# Patient Record
Sex: Male | Born: 1975
Health system: Southern US, Community
[De-identification: ages and names within clinical notes are randomized; demographics above are authoritative.]

## PROBLEM LIST (undated history)

## (undated) DIAGNOSIS — Z87442 Personal history of urinary calculi: Secondary | ICD-10-CM

## (undated) DIAGNOSIS — K589 Irritable bowel syndrome without diarrhea: Secondary | ICD-10-CM

## (undated) DIAGNOSIS — J302 Other seasonal allergic rhinitis: Secondary | ICD-10-CM

## (undated) HISTORY — PX: COLONOSCOPY: SHX5424

## (undated) HISTORY — PX: LITHOTRIPSY: SUR834

## (undated) HISTORY — PX: VASECTOMY: SHX75

## (undated) HISTORY — PX: MYRINGOTOMY: SUR874

---

## 2014-04-10 ENCOUNTER — Other Ambulatory Visit: Payer: Self-pay | Admitting: Urology

## 2014-04-10 ENCOUNTER — Ambulatory Visit (INDEPENDENT_AMBULATORY_CARE_PROVIDER_SITE_OTHER): Payer: BC Managed Care – PPO | Admitting: Urology

## 2014-04-10 ENCOUNTER — Ambulatory Visit (HOSPITAL_COMMUNITY)
Admission: RE | Admit: 2014-04-10 | Discharge: 2014-04-10 | Disposition: A | Discharge status: Home / Self Care | Payer: BC Managed Care – PPO | Source: Ambulatory Visit | Attending: Urology | Admitting: Urology

## 2014-04-10 DIAGNOSIS — R312 Other microscopic hematuria: Secondary | ICD-10-CM

## 2014-04-10 DIAGNOSIS — R109 Unspecified abdominal pain: Secondary | ICD-10-CM | POA: Diagnosis present

## 2014-04-10 DIAGNOSIS — N200 Calculus of kidney: Secondary | ICD-10-CM

## 2014-07-24 ENCOUNTER — Encounter (INDEPENDENT_AMBULATORY_CARE_PROVIDER_SITE_OTHER): Payer: BLUE CROSS/BLUE SHIELD | Admitting: Urology

## 2014-07-24 DIAGNOSIS — Z302 Encounter for sterilization: Secondary | ICD-10-CM

## 2015-12-17 ENCOUNTER — Other Ambulatory Visit: Payer: Self-pay | Admitting: Urology

## 2015-12-17 DIAGNOSIS — N200 Calculus of kidney: Secondary | ICD-10-CM

## 2015-12-27 ENCOUNTER — Other Ambulatory Visit: Payer: Self-pay | Admitting: Urology

## 2015-12-27 ENCOUNTER — Ambulatory Visit (INDEPENDENT_AMBULATORY_CARE_PROVIDER_SITE_OTHER): Payer: BLUE CROSS/BLUE SHIELD | Admitting: Urology

## 2015-12-27 ENCOUNTER — Other Ambulatory Visit (HOSPITAL_COMMUNITY)
Admission: RE | Admit: 2015-12-27 | Discharge: 2015-12-27 | Disposition: A | Discharge status: Home / Self Care | Payer: BLUE CROSS/BLUE SHIELD | Source: Ambulatory Visit | Attending: Urology | Admitting: Urology

## 2015-12-27 ENCOUNTER — Ambulatory Visit (HOSPITAL_COMMUNITY)
Admission: RE | Admit: 2015-12-27 | Discharge: 2015-12-27 | Disposition: A | Discharge status: Home / Self Care | Payer: BLUE CROSS/BLUE SHIELD | Source: Ambulatory Visit | Attending: Urology | Admitting: Urology

## 2015-12-27 DIAGNOSIS — N200 Calculus of kidney: Secondary | ICD-10-CM

## 2015-12-27 DIAGNOSIS — R3129 Other microscopic hematuria: Secondary | ICD-10-CM

## 2015-12-27 LAB — BASIC METABOLIC PANEL
Anion gap: 7 (ref 5–15)
BUN: 12 mg/dL (ref 6–20)
CALCIUM: 9.2 mg/dL (ref 8.9–10.3)
CO2: 26 mmol/L (ref 22–32)
Chloride: 103 mmol/L (ref 101–111)
Creatinine, Ser: 0.88 mg/dL (ref 0.61–1.24)
GFR calc Af Amer: >60 mL/min (ref >60)
GLUCOSE: 86 mg/dL (ref 65–99)
POTASSIUM: 3.8 mmol/L (ref 3.5–5.1)
SODIUM: 136 mmol/L (ref 135–145)

## 2015-12-27 LAB — URIC ACID: URIC ACID, SERUM: 5 mg/dL (ref 4.4–7.6)

## 2015-12-28 LAB — PARATHYROID HORMONE, INTACT (NO CA): PTH: 28 pg/mL (ref 15–65)

## 2015-12-30 ENCOUNTER — Other Ambulatory Visit: Payer: Self-pay | Admitting: Urology

## 2016-01-01 ENCOUNTER — Encounter (HOSPITAL_COMMUNITY): Payer: Self-pay

## 2016-01-06 ENCOUNTER — Ambulatory Visit (HOSPITAL_COMMUNITY)
Admission: RE | Admit: 2016-01-06 | Discharge: 2016-01-06 | Disposition: A | Discharge status: Home / Self Care | Payer: BLUE CROSS/BLUE SHIELD | Source: Ambulatory Visit | Attending: Urology | Admitting: Urology

## 2016-01-06 ENCOUNTER — Encounter (HOSPITAL_COMMUNITY): Payer: Self-pay | Admitting: *Deleted

## 2016-01-06 ENCOUNTER — Encounter (HOSPITAL_COMMUNITY)
Admission: RE | Disposition: A | Discharge status: Home / Self Care | Payer: Self-pay | Source: Ambulatory Visit | Attending: Urology

## 2016-01-06 ENCOUNTER — Ambulatory Visit (HOSPITAL_COMMUNITY): Payer: BLUE CROSS/BLUE SHIELD

## 2016-01-06 DIAGNOSIS — N200 Calculus of kidney: Secondary | ICD-10-CM | POA: Diagnosis present

## 2016-01-06 HISTORY — DX: Irritable bowel syndrome, unspecified: K58.9

## 2016-01-06 HISTORY — DX: Personal history of urinary calculi: Z87.442

## 2016-01-06 HISTORY — DX: Other seasonal allergic rhinitis: J30.2

## 2016-01-06 SURGERY — LITHOTRIPSY, ESWL
Anesthesia: LOCAL | Laterality: Left

## 2016-01-06 MED ORDER — HYDROCODONE-ACETAMINOPHEN 5-325 MG PO TABS: 1.0000 | ORAL_TABLET | Freq: Four times a day (QID) | ORAL | 0 refills | Status: DC | PRN

## 2016-01-06 MED ORDER — CIPROFLOXACIN HCL 500 MG PO TABS
500.0000 mg | ORAL_TABLET | ORAL | Status: AC
  Administered 2016-01-06: 500 mg via ORAL
  Filled 2016-01-06: qty 1

## 2016-01-06 MED ORDER — DIAZEPAM 5 MG PO TABS
10.0000 mg | ORAL_TABLET | ORAL | Status: AC
  Administered 2016-01-06: 10 mg via ORAL
  Filled 2016-01-06: qty 2

## 2016-01-06 MED ORDER — DIPHENHYDRAMINE HCL 25 MG PO CAPS
25.0000 mg | ORAL_CAPSULE | ORAL | Status: AC
  Administered 2016-01-06: 25 mg via ORAL
  Filled 2016-01-06: qty 1

## 2016-01-06 MED ORDER — SODIUM CHLORIDE 0.9 % IV SOLN
INTRAVENOUS | Status: DC
  Administered 2016-01-06: 09:00:00 via INTRAVENOUS

## 2016-01-06 MED ORDER — ACETAMINOPHEN 325 MG PO TABS
650.0000 mg | ORAL_TABLET | ORAL | Status: AC
  Administered 2016-01-06: 650 mg via ORAL
  Filled 2016-01-06: qty 2

## 2016-01-06 NOTE — H&P (Signed)
[facility] Visit     12/27/2015   --------------------------------------------------------------------------------   Scheryl Darter  MRN: [id number]  PRIMARY CARE:  Pat Kocher, MD  DOB: 06-24-1975, 40 year old Male  REFERRING:    SSN: [id number]  PROVIDER:  [facility]    TREATING:  Nicolette Bang, M.D.    LOCATION:  [facility] [phone]   --------------------------------------------------------------------------------   CC: I have kidney stones.  HPI: Brendan Bowman is a 40 year-old male patient who is here for renal calculi.  The problem is on both sides. He first stated noticing pain on approximately 12/07/1990. He is not currently having flank pain, back pain, groin pain, nausea, vomiting, fever or chills. He has caught a stone in his urine strainer since his symptoms began.   He has never had surgical treatment for calculi in the past.   12/27/2015: KUB shows L 1.5cm calculus and right 8-5m calculus. He denies any flank pain. He used to take protein suppliments but has stopped protein suppliments.  24 hour urine 2 years ago showed high calcium, high uric acid, low citrate.     ALLERGIES: No Allergies    MEDICATIONS: Colestid TABS Oral     GU PSH: No GU PSH      PSH Notes: Adenoidectomy   NON-GU PSH: Remove Adenoids - 04/10/2014    GU PMH: Kidney Stone, Nephrolithiasis - 04/10/2014 Other microscopic hematuria, Microscopic hematuria - 04/10/2014    NON-GU PMH: Encounter for other contraceptive management, Visit for postvasectomy sperm count - 11/27/2014 Carbuncle of groin, Boil, groin - 07/24/2014 Personal history of other diseases of the digestive system, History of irritable bowel syndrome - 04/10/2014 Encounter for general adult medical examination without abnormal findings, Encounter for preventive health examination    FAMILY HISTORY: acute poliomyelitis - Runs In Family Atrial Fibrillation - Runs In Family Hypertension - Runs In  Family Myocardial Infarction - Runs In Family Prostate Cancer - Runs In Family   SOCIAL HISTORY: Marital Status: Married Current Smoking Status: Patient has never smoked.  Does not drink anymore.  Drinks 3 caffeinated drinks per day.     Notes: Alcohol use, Occupation, Married, Caffeine use, Never used tobacco, Number of children   REVIEW OF SYSTEMS:    GU Review Male:   Patient denies frequent urination, hard to postpone urination, burning/ pain with urination, get up at night to urinate, leakage of urine, stream starts and stops, trouble starting your stream, have to strain to urinate , erection problems, and penile pain.  Gastrointestinal (Upper):   Patient denies nausea, vomiting, and indigestion/ heartburn.  Gastrointestinal (Lower):   Patient denies diarrhea and constipation.  Constitutional:   Patient denies fever, night sweats, weight loss, and fatigue.  Skin:   Patient denies skin rash/ lesion and itching.  Eyes:   Patient denies blurred vision and double vision.  Ears/ Nose/ Throat:   Patient denies sore throat and sinus problems.  Hematologic/Lymphatic:   Patient denies swollen glands and easy bruising.  Cardiovascular:   Patient denies leg swelling and chest pains.  Respiratory:   Patient denies shortness of breath and cough.  Endocrine:   Patient denies excessive thirst.  Musculoskeletal:   Patient denies back pain and joint pain.  Neurological:   Patient denies headaches and dizziness.  Psychologic:   Patient denies depression and anxiety.   VITAL SIGNS:      12/27/2015 11:16 AM  Weight 150 lb / 68.04 kg  BP 128/64 mmHg  Pulse 70 /  min  Temperature 98.4 F / 37 C   MULTI-SYSTEM PHYSICAL EXAMINATION:    Constitutional: Well-nourished. No physical deformities. Normally developed. Good grooming.  Neck: Neck symmetrical, not swollen. Normal tracheal position.  Respiratory: No labored breathing, no use of accessory muscles.   Cardiovascular: Normal temperature, normal  extremity pulses, no swelling, no varicosities.  Lymphatic: No enlargement of neck, axillae, groin.  Skin: No paleness, no jaundice, no cyanosis. No lesion, no ulcer, no rash.  Neurologic / Psychiatric: Oriented to time, oriented to place, oriented to person. No depression, no anxiety, no agitation.  Gastrointestinal: No mass, no tenderness, no rigidity, non obese abdomen.  Eyes: Normal conjunctivae. Normal eyelids.  Ears, Nose, Mouth, and Throat: Left ear no scars, no lesions, no masses. Right ear no scars, no lesions, no masses. Nose no scars, no lesions, no masses. Normal hearing. Normal lips.  Musculoskeletal: Normal gait and station of head and neck.     PAST DATA REVIEWED:  Source Of History:  Patient   PROCEDURES: None   ASSESSMENT:      ICD-10 Details  1 GU:   Kidney Stone - N20.0   2   Other microscopic hematuria - R31.29    PLAN:           Document Letter(s):  Created for Patient: Clinical Summary    We discussed treatment approaches to renal stones including observation, medical  expulsive therapy (MET), ureteroscopy (URS), shockwave lithotripsy (SWL), and  percutaneous nephrostolithotomy (PCNL) with the later preferred for cases of larger  stones or in cases of unique / complex anatomy. We discussed the relative merits of the  techniques including risks, benefits, and predicted efficacy with PCNL, URS, and SWL  being presented in order of decreasing efficacy, but also decreasing risk in most patients.  After careful consideration, the patient has elected to undergo L ESWL        Notes:   Nephrolithiasis:  -Schedule for L ESWL   Will proceed with 24 hour urine after ESWL  -iPTH, uric acid, BMP      * Signed by Nicolette Bang, M.D. on 12/30/15 at 6:39 AM (EDT)     The information contained in this medical record document is considered private and confidential patient information. This information can only be used for the medical diagnosis and/or medical  services that are being provided by the patient's selected caregivers. This information can only be distributed outside of the patient's care if the patient agrees and signs waivers of authorization for this information to be sent to an outside source or route.

## 2016-01-06 NOTE — Discharge Instructions (Addendum)
Moderate Conscious Sedation, Adult, Care After Refer to this sheet in the next few weeks. These instructions provide you with information on caring for yourself after your procedure. Your health care provider may also give you more specific instructions. Your treatment has been planned according to current medical practices, but problems sometimes occur. Call your health care provider if you have any problems or questions after your procedure. WHAT TO EXPECT AFTER THE PROCEDURE  After your procedure:  You may feel sleepy, clumsy, and have poor balance for several hours.  Vomiting may occur if you eat too soon after the procedure. HOME CARE INSTRUCTIONS  Do not participate in any activities where you could become injured for at least 24 hours. Do not:  Drive.  Swim.  Ride a bicycle.  Operate heavy machinery.  Cook.  Use power tools.  Climb ladders.  Work from a high place.  Do not make important decisions or sign legal documents until you are improved.  If you vomit, drink water, juice, or soup when you can drink without vomiting. Make sure you have little or no nausea before eating solid foods.  Only take over-the-counter or prescription medicines for pain, discomfort, or fever as directed by your health care provider.  Make sure you and your family fully understand everything about the medicines given to you, including what side effects may occur.  You should not drink alcohol, take sleeping pills, or take medicines that cause drowsiness for at least 24 hours.  If you smoke, do not smoke without supervision.  If you are feeling better, you may resume normal activities 24 hours after you were sedated.  Keep all appointments with your health care provider. SEEK MEDICAL CARE IF:  Your skin is pale or bluish in color.  You continue to feel nauseous or vomit.  Your pain is getting worse and is not helped by medicine.  You have bleeding or swelling.  You are still  sleepy or feeling clumsy after 24 hours. SEEK IMMEDIATE MEDICAL CARE IF:  You develop a rash.  You have difficulty breathing.  You develop any type of allergic problem.  You have a fever. MAKE SURE YOU:  Understand these instructions.  Will watch your condition.  Will get help right away if you are not doing well or get worse.   This information is not intended to replace advice given to you by your health care provider. Make sure you discuss any questions you have with your health care provider.   Document Released: 03/15/2013 Document Revised: 06/15/2014 Document Reviewed: 03/15/2013 Elsevier Interactive Patient Education 2016 [employer]. See [facility] discharge instructions in chart.

## 2016-01-06 NOTE — Interval H&P Note (Signed)
History and Physical Interval Note:  01/06/2016 10:31 AM  Brendan Bowman  has presented today for surgery, with the diagnosis of LEFT RENAL CALCULUS  The various methods of treatment have been discussed with the patient and family. After consideration of risks, benefits and other options for treatment, the patient has consented to  Procedure(s) with comments: EXTRACORPOREAL SHOCK WAVE LITHOTRIPSY (ESWL) (Left) - [phone] [id number]  as a surgical intervention .  The patient's history has been reviewed, patient examined, no change in status, stable for surgery.  I have reviewed the patient's chart and labs.  Questions were answered to the patient's satisfaction.     [name] S

## 2016-01-06 NOTE — Op Note (Signed)
See [facility] OP note scanned into chart. 

## 2016-01-15 ENCOUNTER — Other Ambulatory Visit (HOSPITAL_COMMUNITY)
Admission: RE | Admit: 2016-01-15 | Discharge: 2016-01-15 | Disposition: A | Discharge status: Home / Self Care | Payer: BLUE CROSS/BLUE SHIELD | Source: Ambulatory Visit | Attending: Urology | Admitting: Urology

## 2016-01-15 DIAGNOSIS — N200 Calculus of kidney: Secondary | ICD-10-CM | POA: Insufficient documentation

## 2016-01-22 ENCOUNTER — Ambulatory Visit (HOSPITAL_COMMUNITY)
Admission: RE | Admit: 2016-01-22 | Discharge: 2016-01-22 | Disposition: A | Discharge status: Home / Self Care | Payer: BLUE CROSS/BLUE SHIELD | Source: Ambulatory Visit | Attending: Urology | Admitting: Urology

## 2016-01-22 ENCOUNTER — Ambulatory Visit (INDEPENDENT_AMBULATORY_CARE_PROVIDER_SITE_OTHER): Payer: Self-pay | Admitting: Urology

## 2016-01-22 ENCOUNTER — Other Ambulatory Visit: Payer: Self-pay | Admitting: Urology

## 2016-01-22 DIAGNOSIS — Z9889 Other specified postprocedural states: Secondary | ICD-10-CM

## 2016-01-22 DIAGNOSIS — N200 Calculus of kidney: Secondary | ICD-10-CM | POA: Insufficient documentation

## 2016-01-28 LAB — STONE ANALYSIS
CA OXALATE, DIHYDRATE: 5 %
CA OXALATE, MONOHYDR.: 85 %
Ca phos cry stone ql IR: 10 %
STONE WEIGHT KSTONE: 94 mg

## 2016-02-05 ENCOUNTER — Ambulatory Visit (HOSPITAL_COMMUNITY)
Admission: RE | Admit: 2016-02-05 | Discharge: 2016-02-05 | Disposition: A | Discharge status: Home / Self Care | Payer: BLUE CROSS/BLUE SHIELD | Source: Ambulatory Visit | Attending: Urology | Admitting: Urology

## 2016-02-05 ENCOUNTER — Ambulatory Visit (INDEPENDENT_AMBULATORY_CARE_PROVIDER_SITE_OTHER): Payer: BLUE CROSS/BLUE SHIELD | Admitting: Urology

## 2016-02-05 ENCOUNTER — Other Ambulatory Visit: Payer: Self-pay | Admitting: Urology

## 2016-02-05 DIAGNOSIS — Z9889 Other specified postprocedural states: Secondary | ICD-10-CM | POA: Diagnosis not present

## 2016-02-05 DIAGNOSIS — N202 Calculus of kidney with calculus of ureter: Secondary | ICD-10-CM | POA: Diagnosis not present

## 2016-02-05 DIAGNOSIS — N200 Calculus of kidney: Secondary | ICD-10-CM

## 2016-02-05 DIAGNOSIS — R9349 Abnormal radiologic findings on diagnostic imaging of other urinary organs: Secondary | ICD-10-CM | POA: Insufficient documentation

## 2016-02-14 NOTE — Patient Instructions (Addendum)
Your procedure is scheduled on: 02/19/2016  Report to [facility] at   9:30  AM.  Call this number if you have problems the morning of surgery: [phone]   Remember:   Do not drink or eat food:After Midnight.  :  Take these medicines the morning of surgery with A SIP OF WATER: Flonase, Norco and Toradol if needed  Do not wear jewelry, make-up or nail polish.  Do not wear lotions, powders, or perfumes. You may wear deodorant.  Do not shave 48 hours prior to surgery. Men may shave face and neck.  Do not bring valuables to the hospital.  Contacts, dentures or bridgework may not be worn into surgery.  Leave suitcase in the car. After surgery it may be brought to your room.  For patients admitted to the hospital, checkout time is 11:00 AM the day of discharge.   Patients discharged the day of surgery will not be allowed to drive home.    Special Instructions: Shower using CHG night before surgery and shower the day of surgery use CHG.  Use special wash - you have one bottle of CHG for all showers.  You should use approximately 1/2 of the bottle for each shower.  Cystoscopy Cystoscopy is a procedure that is used to help your caregiver diagnose and sometimes treat conditions that affect your lower urinary tract. Your lower urinary tract includes your bladder and the tube through which urine passes from your bladder out of your body (urethra). Cystoscopy is performed with a thin, tube-shaped instrument (cystoscope). The cystoscope has lenses and a light at the end so that your caregiver can see inside your bladder. The cystoscope is inserted at the entrance of your urethra. Your caregiver guides it through your urethra and into your bladder. There are two main types of cystoscopy:  Flexible cystoscopy (with a flexible cystoscope).  Rigid cystoscopy (with a rigid cystoscope). Cystoscopy may be recommended for many conditions, including:  Urinary tract infections.  Blood in your urine  (hematuria).  Loss of bladder control (urinary incontinence) or overactive bladder.  Unusual cells found in a urine sample.  Urinary blockage.  Painful urination. Cystoscopy may also be done to remove a sample of your tissue to be checked under a microscope (biopsy). It may also be done to remove or destroy bladder stones. LET YOUR CAREGIVER KNOW ABOUT:  Allergies to food or medicine.  Medicines taken, including vitamins, herbs, eyedrops, over-the-counter medicines, and creams.  Use of steroids (by mouth or creams).  Previous problems with anesthetics or numbing medicines.  History of bleeding problems or blood clots.  Previous surgery.  Other health problems, including diabetes and kidney problems.  Possibility of pregnancy, if this applies. PROCEDURE The area around the opening to your urethra will be cleaned. A medicine to numb your urethra (local anesthetic) is used. If a tissue sample or stone is removed during the procedure, you may be given a medicine to make you sleep (general anesthetic). Your caregiver will gently insert the tip of the cystoscope into your urethra. The cystoscope will be slowly glided through your urethra and into your bladder. Sterile fluid will flow through the cystoscope and into your bladder. The fluid will expand and stretch your bladder. This gives your caregiver a better view of your bladder walls. The procedure lasts about 15-20 minutes. AFTER THE PROCEDURE If a local anesthetic is used, you will be allowed to go home as soon as you are ready. If a general anesthetic is used, you  will be taken to a recovery area until you are stable. You may have temporary bleeding and burning on urination.   This information is not intended to replace advice given to you by your health care provider. Make sure you discuss any questions you have with your health care provider.   Document Released: 05/22/2000 Document Revised: 06/15/2014 Document Reviewed:  11/16/2011 Elsevier Interactive Patient Education 2016 Elsevier Inc[employer] Cystoscopy, Care After Refer to this sheet in the next few weeks. These instructions provide you with information on caring for yourself after your procedure. Your caregiver may also give you more specific instructions. Your treatment has been planned according to current medical practices, but problems sometimes occur. Call your caregiver if you have any problems or questions after your procedure. HOME CARE INSTRUCTIONS  Things you can do to ease any discomfort after your procedure include:  Drinking enough water and fluids to keep your urine clear or pale yellow.  Taking a warm bath to relieve any burning feelings. SEEK IMMEDIATE MEDICAL CARE IF:   You have an increase in blood in your urine.  You notice blood clots in your urine.  You have difficulty passing urine.  You have the chills.  You have abdominal pain.  You have a fever or persistent symptoms for more than 2-3 days.  You have a fever and your symptoms suddenly get worse. MAKE SURE YOU:   Understand these instructions.  Will watch your condition.  Will get help right away if you are not doing well or get worse.   This information is not intended to replace advice given to you by your health care provider. Make sure you discuss any questions you have with your health care provider.   Document Released: 12/12/2004 Document Revised: 06/15/2014 Document Reviewed: 11/16/2011 Elsevier Interactive Patient Education 2016 Elsevier Inc.  Anesthesia, Adult, Care After Refer to this sheet in the next few weeks. These instructions provide you with information on caring for yourself after your procedure. Your health care provider may also give you more specific instructions. Your treatment has been planned according to current medical practices, but problems sometimes occur. Call your health care provider if you have any problems or questions after your  procedure. WHAT TO EXPECT AFTER THE PROCEDURE After the procedure, it is typical to experience:  Sleepiness.  Nausea and vomiting. HOME CARE INSTRUCTIONS  For the first 24 hours after general anesthesia:  Have a responsible person with you.  Do not drive a car. If you are alone, do not take public transportation.  Do not drink alcohol.  Do not take medicine that has not been prescribed by your health care provider.  Do not sign important papers or make important decisions.  You may resume a normal diet and activities as directed by your health care provider.  Change bandages (dressings) as directed.  If you have questions or problems that seem related to general anesthesia, call the hospital and ask for the anesthetist or anesthesiologist on call. SEEK MEDICAL CARE IF:  You have nausea and vomiting that continue the day after anesthesia.  You develop a rash. SEEK IMMEDIATE MEDICAL CARE IF:   You have difficulty breathing.  You have chest pain.  You have any allergic problems.   This information is not intended to replace advice given to you by your health care provider. Make sure you discuss any questions you have with your health care provider.   Document Released: 08/31/2000 Document Revised: 06/15/2014 Document Reviewed: 09/23/2011 Elsevier Interactive Patient Education  2016 [employer].

## 2016-02-17 ENCOUNTER — Ambulatory Visit (HOSPITAL_COMMUNITY): Admission: RE | Admit: 2016-02-17 | Payer: BLUE CROSS/BLUE SHIELD | Source: Ambulatory Visit

## 2016-02-17 ENCOUNTER — Encounter (HOSPITAL_COMMUNITY): Payer: Self-pay

## 2016-02-17 ENCOUNTER — Encounter (HOSPITAL_COMMUNITY)
Admission: RE | Admit: 2016-02-17 | Discharge: 2016-02-17 | Disposition: A | Discharge status: Home / Self Care | Payer: BLUE CROSS/BLUE SHIELD | Source: Ambulatory Visit | Attending: Urology | Admitting: Urology

## 2016-02-17 DIAGNOSIS — Z01811 Encounter for preprocedural respiratory examination: Secondary | ICD-10-CM

## 2016-02-17 DIAGNOSIS — Z79899 Other long term (current) drug therapy: Secondary | ICD-10-CM | POA: Diagnosis not present

## 2016-02-17 DIAGNOSIS — R109 Unspecified abdominal pain: Secondary | ICD-10-CM | POA: Diagnosis present

## 2016-02-17 DIAGNOSIS — Z7951 Long term (current) use of inhaled steroids: Secondary | ICD-10-CM | POA: Diagnosis not present

## 2016-02-17 DIAGNOSIS — N200 Calculus of kidney: Secondary | ICD-10-CM | POA: Diagnosis not present

## 2016-02-17 LAB — BASIC METABOLIC PANEL
ANION GAP: 11 (ref 5–15)
BUN: 15 mg/dL (ref 6–20)
CHLORIDE: 103 mmol/L (ref 101–111)
CO2: 26 mmol/L (ref 22–32)
Calcium: 9.5 mg/dL (ref 8.9–10.3)
Creatinine, Ser: 0.85 mg/dL (ref 0.61–1.24)
GFR calc Af Amer: >60 mL/min (ref >60)
GLUCOSE: 77 mg/dL (ref 65–99)
POTASSIUM: 3.8 mmol/L (ref 3.5–5.1)
SODIUM: 140 mmol/L (ref 135–145)

## 2016-02-17 LAB — CBC
HCT: 45.2 % (ref 39.0–52.0)
HEMOGLOBIN: 15.4 g/dL (ref 13.0–17.0)
MCH: 30.7 pg (ref 26.0–34.0)
MCHC: 34.1 g/dL (ref 30.0–36.0)
MCV: 90 fL (ref 78.0–100.0)
Platelets: 234 10*3/uL (ref 150–400)
RBC: 5.02 MIL/uL (ref 4.22–5.81)
RDW: 12.4 % (ref 11.5–15.5)
WBC: 9.3 10*3/uL (ref 4.0–10.5)

## 2016-02-19 ENCOUNTER — Ambulatory Visit (HOSPITAL_COMMUNITY): Payer: BLUE CROSS/BLUE SHIELD | Admitting: Anesthesiology

## 2016-02-19 ENCOUNTER — Ambulatory Visit (HOSPITAL_COMMUNITY): Payer: BLUE CROSS/BLUE SHIELD

## 2016-02-19 ENCOUNTER — Encounter (HOSPITAL_COMMUNITY)
Admission: RE | Disposition: A | Discharge status: Home / Self Care | Payer: Self-pay | Source: Ambulatory Visit | Attending: Urology

## 2016-02-19 ENCOUNTER — Encounter (HOSPITAL_COMMUNITY): Payer: Self-pay | Admitting: Certified Registered Nurse Anesthetist

## 2016-02-19 ENCOUNTER — Ambulatory Visit (HOSPITAL_COMMUNITY)
Admission: RE | Admit: 2016-02-19 | Discharge: 2016-02-19 | Disposition: A | Discharge status: Home / Self Care | Payer: BLUE CROSS/BLUE SHIELD | Source: Ambulatory Visit | Attending: Urology | Admitting: Urology

## 2016-02-19 DIAGNOSIS — N201 Calculus of ureter: Secondary | ICD-10-CM

## 2016-02-19 DIAGNOSIS — Z7951 Long term (current) use of inhaled steroids: Secondary | ICD-10-CM | POA: Insufficient documentation

## 2016-02-19 DIAGNOSIS — Z79899 Other long term (current) drug therapy: Secondary | ICD-10-CM | POA: Insufficient documentation

## 2016-02-19 DIAGNOSIS — N200 Calculus of kidney: Secondary | ICD-10-CM | POA: Insufficient documentation

## 2016-02-19 HISTORY — PX: HOLMIUM LASER APPLICATION: SHX5852

## 2016-02-19 HISTORY — PX: CYSTOSCOPY WITH STENT PLACEMENT: SHX5790

## 2016-02-19 HISTORY — PX: CYSTOSCOPY/RETROGRADE/URETEROSCOPY/STONE EXTRACTION WITH BASKET: SHX5317

## 2016-02-19 SURGERY — CYSTOSCOPY, WITH CALCULUS REMOVAL USING BASKET
Anesthesia: General

## 2016-02-19 MED ORDER — DEXAMETHASONE SODIUM PHOSPHATE 4 MG/ML IJ SOLN
INTRAMUSCULAR | Status: AC
  Filled 2016-02-19: qty 1

## 2016-02-19 MED ORDER — DIATRIZOATE MEGLUMINE 30 % UR SOLN
URETHRAL | Status: AC
  Filled 2016-02-19: qty 300

## 2016-02-19 MED ORDER — PROPOFOL 10 MG/ML IV BOLUS
INTRAVENOUS | Status: DC | PRN
  Administered 2016-02-19: 200 mg via INTRAVENOUS

## 2016-02-19 MED ORDER — FENTANYL CITRATE (PF) 100 MCG/2ML IJ SOLN
25.0000 ug | INTRAMUSCULAR | Status: DC | PRN
  Administered 2016-02-19: 25 ug via INTRAVENOUS

## 2016-02-19 MED ORDER — KETOROLAC TROMETHAMINE 10 MG PO TABS: 10.0000 mg | ORAL_TABLET | Freq: Every day | ORAL | 0 refills | Status: DC | PRN

## 2016-02-19 MED ORDER — LACTATED RINGERS IV SOLN
INTRAVENOUS | Status: DC
  Administered 2016-02-19 (×2): via INTRAVENOUS

## 2016-02-19 MED ORDER — PROPOFOL 10 MG/ML IV BOLUS
INTRAVENOUS | Status: AC
  Filled 2016-02-19: qty 20

## 2016-02-19 MED ORDER — CEFAZOLIN SODIUM-DEXTROSE 2-4 GM/100ML-% IV SOLN
2.0000 g | Freq: Once | INTRAVENOUS | Status: AC
  Administered 2016-02-19: 2 g via INTRAVENOUS
  Filled 2016-02-19: qty 100

## 2016-02-19 MED ORDER — ONDANSETRON HCL 4 MG/2ML IJ SOLN
4.0000 mg | Freq: Once | INTRAMUSCULAR | Status: AC
  Administered 2016-02-19: 4 mg via INTRAVENOUS

## 2016-02-19 MED ORDER — TAMSULOSIN HCL 0.4 MG PO CAPS: 0.4000 mg | ORAL_CAPSULE | Freq: Every day | ORAL | 0 refills | Status: DC

## 2016-02-19 MED ORDER — DIATRIZOATE MEGLUMINE 30 % UR SOLN
URETHRAL | Status: DC | PRN
  Administered 2016-02-19: 100 mL

## 2016-02-19 MED ORDER — SODIUM CHLORIDE 0.9 % IR SOLN
Status: DC | PRN
  Administered 2016-02-19 (×2): 3000 mL

## 2016-02-19 MED ORDER — FENTANYL CITRATE (PF) 100 MCG/2ML IJ SOLN
INTRAMUSCULAR | Status: AC
  Filled 2016-02-19: qty 2

## 2016-02-19 MED ORDER — OXYCODONE-ACETAMINOPHEN 5-325 MG PO TABS: 1.0000 | ORAL_TABLET | Freq: Every day | ORAL | 0 refills | Status: DC | PRN

## 2016-02-19 MED ORDER — FENTANYL CITRATE (PF) 100 MCG/2ML IJ SOLN
25.0000 ug | INTRAMUSCULAR | Status: DC | PRN
  Administered 2016-02-19: 25 ug via INTRAVENOUS
  Filled 2016-02-19 (×2): qty 2

## 2016-02-19 MED ORDER — ONDANSETRON HCL 4 MG/2ML IJ SOLN
INTRAMUSCULAR | Status: AC
  Filled 2016-02-19: qty 2

## 2016-02-19 MED ORDER — HYDROMORPHONE HCL 1 MG/ML IJ SOLN
0.2500 mg | INTRAMUSCULAR | Status: DC | PRN
  Administered 2016-02-19 (×2): 0.5 mg via INTRAVENOUS
  Filled 2016-02-19: qty 1

## 2016-02-19 MED ORDER — OXYCODONE-ACETAMINOPHEN 5-325 MG PO TABS: 1.0000 | ORAL_TABLET | Freq: Four times a day (QID) | ORAL | 0 refills | Status: DC | PRN

## 2016-02-19 MED ORDER — KETOROLAC TROMETHAMINE 10 MG PO TABS: 10.0000 mg | ORAL_TABLET | Freq: Three times a day (TID) | ORAL | 0 refills | Status: DC | PRN

## 2016-02-19 MED ORDER — MIDAZOLAM HCL 2 MG/2ML IJ SOLN
1.0000 mg | INTRAMUSCULAR | Status: DC | PRN
  Administered 2016-02-19: 2 mg via INTRAVENOUS
  Filled 2016-02-19: qty 2

## 2016-02-19 MED ORDER — FENTANYL CITRATE (PF) 100 MCG/2ML IJ SOLN
INTRAMUSCULAR | Status: DC | PRN
  Administered 2016-02-19: 50 ug via INTRAVENOUS

## 2016-02-19 MED ORDER — SULFAMETHOXAZOLE-TRIMETHOPRIM 800-160 MG PO TABS: 1.0000 | ORAL_TABLET | Freq: Two times a day (BID) | ORAL | 0 refills | Status: DC

## 2016-02-19 MED ORDER — LIDOCAINE HCL (CARDIAC) 10 MG/ML IV SOLN
INTRAVENOUS | Status: DC | PRN
  Administered 2016-02-19: 50 mg via INTRAVENOUS

## 2016-02-19 MED ORDER — DEXAMETHASONE SODIUM PHOSPHATE 4 MG/ML IJ SOLN
4.0000 mg | INTRAMUSCULAR | Status: AC
  Administered 2016-02-19: 4 mg via INTRAVENOUS

## 2016-02-19 SURGICAL SUPPLY — 26 items
BAG DRAIN URO TABLE W/ADPT NS (DRAPE) ×4 IMPLANT
BAG HAMPER (MISCELLANEOUS) ×4 IMPLANT
CATH INTERMIT  6FR 70CM (CATHETERS) IMPLANT
CLOTH BEACON ORANGE TIMEOUT ST (SAFETY) ×4 IMPLANT
EXTRACTOR STONE NITINOL NGAGE (UROLOGICAL SUPPLIES) ×4 IMPLANT
FIBER LASER FLEXIVA 200 (UROLOGICAL SUPPLIES) ×4 IMPLANT
FIBER LASER FLEXIVA 365 (UROLOGICAL SUPPLIES) IMPLANT
GLOVE BIO SURGEON STRL SZ8 (GLOVE) ×4 IMPLANT
GLOVE BIOGEL PI IND STRL 6.5 (GLOVE) ×2 IMPLANT
GLOVE BIOGEL PI INDICATOR 6.5 (GLOVE) ×2
GLOVE ECLIPSE 6.5 STRL STRAW (GLOVE) ×4 IMPLANT
GLOVE EXAM NITRILE PF MED BLUE (GLOVE) ×4 IMPLANT
GOWN STRL REUS W/ TWL LRG LVL3 (GOWN DISPOSABLE) ×2 IMPLANT
GOWN STRL REUS W/ TWL XL LVL3 (GOWN DISPOSABLE) ×2 IMPLANT
GOWN STRL REUS W/TWL LRG LVL3 (GOWN DISPOSABLE) ×2
GOWN STRL REUS W/TWL XL LVL3 (GOWN DISPOSABLE) ×2
GUIDEWIRE ANG ZIPWIRE 038X150 (WIRE) ×4 IMPLANT
GUIDEWIRE STR DUAL SENSOR (WIRE) ×4 IMPLANT
IV NS IRRIG 3000ML ARTHROMATIC (IV SOLUTION) ×8 IMPLANT
MANIFOLD NEPTUNE II (INSTRUMENTS) ×4 IMPLANT
PACK CYSTO (CUSTOM PROCEDURE TRAY) ×4 IMPLANT
PAD ARMBOARD 7.5X6 YLW CONV (MISCELLANEOUS) ×4 IMPLANT
SHEATH ACCESS URETERAL 38CM (SHEATH) ×4 IMPLANT
STENT URET 6FRX26 CONTOUR (STENTS) ×8 IMPLANT
SYRINGE 10CC LL (SYRINGE) ×4 IMPLANT
TAPE CLOTH SILK CARING 3INX10 (GAUZE/BANDAGES/DRESSINGS) ×4 IMPLANT

## 2016-02-19 NOTE — Discharge Instructions (Signed)
Ureteroscopy, Care After °Refer to this sheet in the next few weeks. These instructions provide you with information on caring for yourself after your procedure. Your health care provider may also give you more specific instructions. Your treatment has been planned according to current medical practices, but problems sometimes occur. Call your health care provider if you have any problems or questions after your procedure.  °WHAT TO EXPECT AFTER THE PROCEDURE  °After your procedure, it is typical to have the following:  °· A burning sensation when you urinate. °· Blood in your urine. °HOME CARE INSTRUCTIONS  °· Only take medicines as directed by your health care provider. Do not take any over-the-counter pain medication unless your health care provider says it is okay. °· Take a warm bath or hold a warm washcloth over your groin to relieve burning. °· Drink enough fluids to keep your urine clear or pale yellow. °¨ Drink two 8-ounce glasses of water every hour for the first 2 hours after you get home. °¨ Continue to drink water often at home. °· You can eat what you usually do. °· Ask your surgeon when you can do your usual activities. °· If you had a tube placed to keep urine flowing (ureteral stent), ask your health care provider when you need to return to have it removed. °· Keep all follow-up appointments. °SEEK MEDICAL CARE IF:  °· You have chills or fever. °· You have burning pain for longer than 24 hours after the procedure. °· You have blood in your urine for longer than 24 hours after the procedure. °SEEK IMMEDIATE MEDICAL CARE IF:  °· You have large amounts of blood or clots in your urine. °· You have very bad pain. °· You have chest pain or trouble breathing. °  °This information is not intended to replace advice given to you by your health care provider. Make sure you discuss any questions you have with your health care provider. °  °Document Released: 05/30/2013 Document Reviewed: 05/30/2013 °Elsevier  Interactive Patient Education ©2016 Elsevier Inc. ° °

## 2016-02-19 NOTE — H&P (Signed)
Urology Admission H&P  Chief Complaint: left flank pain  History of Present Illness: Brendan Bowman is a 40yo with a hx of nephrolithiasis who is here for L URS. He underwent L ESWl and has passed several fragments but he has multipel residula ureteral and renal calculi  Past Medical History:  Diagnosis Date  . History of kidney stones   . IBS (irritable bowel syndrome)   . Seasonal allergies    Past Surgical History:  Procedure Laterality Date  . COLONOSCOPY     For IBS, No polyps  . LITHOTRIPSY    . MYRINGOTOMY Bilateral as a child  . VASECTOMY  early 2017    Home Medications:  Prescriptions Prior to Admission  Medication Sig Dispense Refill Last Dose  . colestipol (COLESTID) 1 g tablet Take 2 g by mouth 3 (three) times daily. Before meals.   02/18/2016 at 1530  . fluticasone (FLONASE) 50 MCG/ACT nasal spray Place 1 spray into both nostrils daily.   02/18/2016 at 1530  . ibuprofen (ADVIL,MOTRIN) 200 MG tablet Take 800 mg by mouth every 6 (six) hours as needed for moderate pain.   Past Week at Unknown time  . ketorolac (TORADOL) 10 MG tablet Take 1 tablet by mouth daily as needed for pain.   02/18/2016 at 1530  . oxyCODONE-acetaminophen (PERCOCET/ROXICET) 5-325 MG tablet Take 1 tablet by mouth daily as needed for pain.   02/18/2016 at 15330  . tamsulosin (FLOMAX) 0.4 MG CAPS capsule Take 1 capsule by mouth daily.   02/18/2016 at 1530  . diphenhydrAMINE (BENADRYL) 25 MG tablet Take 25 mg by mouth at bedtime as needed for allergies.   More than a month at Unknown time  . HYDROcodone-acetaminophen (NORCO/VICODIN) 5-325 MG tablet Take 1-2 tablets by mouth every 6 (six) hours as needed. (Patient not taking: Reported on 02/12/2016) 20 tablet 0 Completed Course at Unknown time   Allergies: No Known Allergies  No family history on file. Social History:  reports that he has never smoked. He has never used smokeless tobacco. He reports that he does not drink alcohol or use drugs.  Review of Systems   Genitourinary: Positive for flank pain, frequency and urgency.  All other systems reviewed and are negative.   Physical Exam:  Vital signs in last 24 hours: Temp:  [97.6 F (36.4 C)] 97.6 F (36.4 C) (09/13 0939) Resp:  [0-18] 13 (09/13 1040) BP: (99-121)/(56-80) 114/63 (09/13 1040) SpO2:  [99 %-100 %] 99 % (09/13 1040) Physical Exam  Constitutional: He is oriented to person, place, and time. He appears well-developed and well-nourished.  HENT:  Head: Normocephalic and atraumatic.  Eyes: EOM are normal. Pupils are equal, round, and reactive to light.  Neck: Normal range of motion. No thyromegaly present.  Cardiovascular: Normal rate and regular rhythm.   Respiratory: Effort normal. No respiratory distress.  GI: Soft. He exhibits no distension.  Musculoskeletal: Normal range of motion. He exhibits no edema.  Neurological: He is alert and oriented to person, place, and time.  Skin: Skin is warm and dry.  Psychiatric: He has a normal mood and affect. His behavior is normal. Judgment and thought content normal.    Laboratory Data:  No results found for this or any previous visit (from the past 24 hour(s)). No results found for this or any previous visit (from the past 240 hour(s)). Creatinine:  Recent Labs  02/17/16 1434  CREATININE 0.85   Baseline Creatinine: 0.85  Impression/Assessment:  40yo with left renal/ureteral calculi  Plan:  The risks/benefits/alternatives to L URS was explained to the patient and he understands and wishes to proceed with surgery  Brendan Bowman 02/19/2016, 10:42 AM

## 2016-02-19 NOTE — Anesthesia Procedure Notes (Signed)
Procedure Name: Intubation Date/Time: 02/19/2016 11:25 AM Performed by: Patrcia DollyMOSES, Tierria Watson Pre-anesthesia Checklist: Patient identified, Patient being monitored, Timeout performed, Emergency Drugs available and Suction available Patient Re-evaluated:Patient Re-evaluated prior to inductionOxygen Delivery Method: Circle System Utilized Preoxygenation: Pre-oxygenation with 100% oxygen Intubation Type: IV induction Ventilation: Mask ventilation without difficulty LMA Size: 4.0 Number of attempts: 1 Placement Confirmation: positive ETCO2 and breath sounds checked- equal and bilateral Tube secured with: Tape Dental Injury: Teeth and Oropharynx as per pre-operative assessment

## 2016-02-19 NOTE — Brief Op Note (Signed)
02/19/2016  12:47 PM  PATIENT:  Brendan Bowman  40 y.o. male  PRE-OPERATIVE DIAGNOSIS:  bilateral ureteral and renal calculi  POST-OPERATIVE DIAGNOSIS:  bilateral ureteral and renal calculi  PROCEDURE:  Procedure(s): CYSTOSCOPY/BILATERAL RETROGRADE PYELOGRAM/ STONE EXTRACTION WITH BASKET (Bilateral) CYSTOSCOPY WITH LEFT URETERAL STENT PLACEMENT (Left) HOLMIUM LASER APPLICATION (N/A)  SURGEON:  Surgeon(s) and Role:    * Malen GauzePatrick L Jermya Dowding, MD - Primary  PHYSICIAN ASSISTANT:   ASSISTANTS: none   ANESTHESIA:   general  EBL:  Total I/O In: 1000 [I.V.:1000] Out: -   BLOOD ADMINISTERED:none  DRAINS: bilateral 6x26 JJ ureteral stents with tethers   LOCAL MEDICATIONS USED:  NONE  SPECIMEN:  Source of Specimen:  bilateral ureteral calculi  DISPOSITION OF SPECIMEN:  N/A  COUNTS:  YES  TOURNIQUET:  * No tourniquets in log *  DICTATION: .Note written in EPIC  PLAN OF CARE: Discharge to home after PACU  PATIENT DISPOSITION:  PACU - hemodynamically stable.   Delay start of Pharmacological VTE agent (>24hrs) due to surgical blood loss or risk of bleeding: not applicable

## 2016-02-19 NOTE — Anesthesia Preprocedure Evaluation (Signed)
Anesthesia Evaluation  Patient identified by MRN, date of birth, ID band Patient awake    Reviewed: Allergy & Precautions, NPO status , Patient's Chart, lab work & pertinent test results  Airway Mallampati: I  TM Distance: >3 FB     Dental  (+) Teeth Intact   Pulmonary neg pulmonary ROS,    breath sounds clear to auscultation       Cardiovascular negative cardio ROS   Rhythm:Regular Rate:Normal     Neuro/Psych negative neurological ROS     GI/Hepatic negative GI ROS,   Endo/Other    Renal/GU      Musculoskeletal   Abdominal   Peds  Hematology   Anesthesia Other Findings   Reproductive/Obstetrics                             Anesthesia Physical Anesthesia Plan  ASA: I  Anesthesia Plan: General   Post-op Pain Management:    Induction: Intravenous  Airway Management Planned: LMA  Additional Equipment:   Intra-op Plan:   Post-operative Plan: Extubation in OR  Informed Consent: I have reviewed the patients History and Physical, chart, labs and discussed the procedure including the risks, benefits and alternatives for the proposed anesthesia with the patient or authorized representative who has indicated his/her understanding and acceptance.     Plan Discussed with:   Anesthesia Plan Comments:         Anesthesia Quick Evaluation

## 2016-02-19 NOTE — Anesthesia Postprocedure Evaluation (Signed)
Anesthesia Post Note  Patient: Brendan Bowman  Procedure(s) Performed: Procedure(s) (LRB): CYSTOSCOPY/BILATERAL RETROGRADE PYELOGRAM/ STONE EXTRACTION WITH BASKET (Bilateral) CYSTOSCOPY WITH LEFT URETERAL STENT PLACEMENT (Left) HOLMIUM LASER APPLICATION (N/A)  Patient location during evaluation: PACU Anesthesia Type: General Level of consciousness: awake, awake and alert, oriented, patient cooperative and responds to stimulation Pain management: pain level controlled Vital Signs Assessment: post-procedure vital signs reviewed and stable Respiratory status: spontaneous breathing, respiratory function stable and non-rebreather facemask Cardiovascular status: stable Anesthetic complications: no    Last Vitals:  Vitals:   02/19/16 1035 02/19/16 1040  BP: (!) 99/56 114/63  Resp: 15 13  Temp:      Last Pain:  Vitals:   02/19/16 0939  TempSrc: Oral  PainSc:                  Brendan Bowman

## 2016-02-19 NOTE — Transfer of Care (Signed)
Immediate Anesthesia Transfer of Care Note  Patient: Leandra KernJoseph Deyarmin  Procedure(s) Performed: Procedure(s): CYSTOSCOPY/BILATERAL RETROGRADE PYELOGRAM/ STONE EXTRACTION WITH BASKET (Bilateral) CYSTOSCOPY WITH LEFT URETERAL STENT PLACEMENT (Left) HOLMIUM LASER APPLICATION (N/A)  Patient Location: PACU  Anesthesia Type:General  Level of Consciousness: awake, alert , oriented, patient cooperative and responds to stimulation  Airway & Oxygen Therapy: Patient Spontanous Breathing and Patient connected to face mask oxygen  Post-op Assessment: Report given to RN, Post -op Vital signs reviewed and unstable, Anesthesiologist notified and Patient moving all extremities  Post vital signs: Reviewed and stable  Last Vitals:  Vitals:   02/19/16 1035 02/19/16 1040  BP: (!) 99/56 114/63  Resp: 15 13  Temp:      Last Pain:  Vitals:   02/19/16 0939  TempSrc: Oral  PainSc:       Patients Stated Pain Goal: 8 (02/19/16 0937)  Complications: No apparent anesthesia complications

## 2016-02-23 NOTE — Op Note (Signed)
Marland Kitchen.Preoperative diagnosis: bilateral renal calculi  Postoperative diagnosis: Same  Procedure: 1 cystoscopy 2. bilateralretrograde pyelography 3.  Intraoperative fluoroscopy, under one hour, with interpretation 4.  Bilateral ureteroscopic stone manipulation with laser lithotripsy 5.  bilateral 6 x 26 JJ stent placement  Attending: Cleda MccreedyPatrick Mackenzie  Anesthesia: General  Estimated blood loss: None  Drains: bilateral 6 x 26 JJ ureteral stent with tether  Specimens: stone for analysis  Antibiotics: ancef  Findings: bilateral mid and lower pole renal calculi. No hydronephrosis. No masses/lesions in the bladder. Ureteral orifices in normal anatomic location.  Indications: Patient is a 40 year old male with a history of bilateral renal calculi and bilateral flank pain. After discussing treatment options, they decided proceed with bilateral ureteroscopic stone manipulation.  Procedure her in detail: The patient was brought to the operating room and a brief timeout was done to ensure correct patient, correct procedure, correct site.  General anesthesia was administered patient was placed in dorsal lithotomy position.  Her genitalia was then prepped and draped in usual sterile fashion.  A rigid 22 French cystoscope was passed in the urethra and the bladder.  Bladder was inspected free masses or lesions.  the ureteral orifices were in the normal orthotopic locations. a 6 french ureteral catheter was then instilled into the left ureteral orifice.  a gentle retrograde was obtained and findings noted above. We then advanced a zipwire through the catheter and up to the renal pelvis.  we then removed the cystoscope and cannulated the left ureteral orifice with a semirigid ureteroscope.  We located no stone in the ureter. We then placed a sensor wire up to the renal pelvis. We removed the scope and advanced a 12/14 x 38cm access sheath up to the renal pelvis. We then used the flexible ureteroscope to perform  nephroscopy. We located calculi in the lower pole and using a 200nm laser fiber the stone was fragmented. The fragments were then removed with an NGage basket. Once the stone were removed we then removed the access sheath under direct vision and noted to injury to the ureter.  We then placed a 6 x 26 double-j ureteral stent over the original zip wire. We then removed the wire and good coil was noted in the the renal pelvis under fluoroscopy and the bladder under direct vision.  We then turned out attention to the right side. We then advanced a zipwire through the catheter and up to the renal pelvis.  we then removed the cystoscope and cannulated the left ureteral orifice with a semirigid ureteroscope.  We located no stone in the ureter. We then placed a sensor wire up to the renal pelvis. We removed the scope and advanced a 12/14 x 38cm access sheath up to the renal pelvis. We then used the flexible ureteroscope to perform nephroscopy. We located calculi in the mid and lower poles and using a 200nm laser fiber the stones were fragmented. The fragments were then removed with an NGage basket Once the stone were removed we then removed the access sheath under direct vision and noted to injury to the ureter. we then placed a 6 x 26 double-j ureteral stent over the original zip wire.  We then removed the wire and good coil was noted in the the renal pelvis under fluoroscopy and the bladder under direct vision.   the bladder was then drained and this concluded the procedure which was well tolerated by patient.  Complications: None  Condition: Stable, extubated, transferred to PACU  Plan: Patient is to  be discharged home as to follow-up in 2 weeks. Hhe is to remove her stents by pulling the tether in 72 hours

## 2016-02-24 ENCOUNTER — Encounter (HOSPITAL_COMMUNITY): Payer: Self-pay | Admitting: Urology

## 2016-03-02 LAB — STONE ANALYSIS
Ca Oxalate,Dihydrate: 3 %
Ca Oxalate,Monohydr.: 82 %
Ca phos cry stone ql IR: 15 %
STONE WEIGHT KSTONE: 84 mg

## 2016-03-04 ENCOUNTER — Ambulatory Visit (INDEPENDENT_AMBULATORY_CARE_PROVIDER_SITE_OTHER): Payer: BLUE CROSS/BLUE SHIELD | Admitting: Urology

## 2016-03-04 DIAGNOSIS — N200 Calculus of kidney: Secondary | ICD-10-CM | POA: Diagnosis not present

## 2016-05-08 ENCOUNTER — Other Ambulatory Visit: Payer: Self-pay | Admitting: Urology

## 2016-05-08 DIAGNOSIS — N200 Calculus of kidney: Secondary | ICD-10-CM

## 2016-06-03 ENCOUNTER — Ambulatory Visit (HOSPITAL_COMMUNITY)
Admission: RE | Admit: 2016-06-03 | Discharge: 2016-06-03 | Disposition: A | Discharge status: Home / Self Care | Payer: BLUE CROSS/BLUE SHIELD | Source: Ambulatory Visit | Attending: Urology | Admitting: Urology

## 2016-06-03 ENCOUNTER — Ambulatory Visit (INDEPENDENT_AMBULATORY_CARE_PROVIDER_SITE_OTHER): Payer: BLUE CROSS/BLUE SHIELD | Admitting: Urology

## 2016-06-03 DIAGNOSIS — N200 Calculus of kidney: Secondary | ICD-10-CM | POA: Insufficient documentation

## 2016-07-22 ENCOUNTER — Other Ambulatory Visit: Payer: Self-pay | Admitting: Urology

## 2016-07-22 DIAGNOSIS — N200 Calculus of kidney: Secondary | ICD-10-CM

## 2016-08-25 ENCOUNTER — Ambulatory Visit (HOSPITAL_COMMUNITY)
Admission: RE | Admit: 2016-08-25 | Discharge: 2016-08-25 | Disposition: A | Discharge status: Home / Self Care | Payer: BLUE CROSS/BLUE SHIELD | Source: Ambulatory Visit | Attending: Urology | Admitting: Urology

## 2016-08-25 DIAGNOSIS — N200 Calculus of kidney: Secondary | ICD-10-CM | POA: Diagnosis present

## 2016-08-26 ENCOUNTER — Ambulatory Visit (INDEPENDENT_AMBULATORY_CARE_PROVIDER_SITE_OTHER): Payer: BLUE CROSS/BLUE SHIELD | Admitting: Urology

## 2016-08-26 DIAGNOSIS — N200 Calculus of kidney: Secondary | ICD-10-CM | POA: Diagnosis not present

## 2017-01-01 ENCOUNTER — Other Ambulatory Visit: Payer: Self-pay | Admitting: Urology

## 2017-01-01 ENCOUNTER — Ambulatory Visit (HOSPITAL_COMMUNITY)
Admission: RE | Admit: 2017-01-01 | Discharge: 2017-01-01 | Disposition: A | Discharge status: Home / Self Care | Payer: BLUE CROSS/BLUE SHIELD | Source: Ambulatory Visit | Attending: Urology | Admitting: Urology

## 2017-01-01 DIAGNOSIS — N201 Calculus of ureter: Secondary | ICD-10-CM

## 2017-01-01 DIAGNOSIS — N202 Calculus of kidney with calculus of ureter: Secondary | ICD-10-CM | POA: Diagnosis not present

## 2017-01-01 DIAGNOSIS — N200 Calculus of kidney: Secondary | ICD-10-CM

## 2017-02-10 ENCOUNTER — Ambulatory Visit (INDEPENDENT_AMBULATORY_CARE_PROVIDER_SITE_OTHER): Payer: BLUE CROSS/BLUE SHIELD | Admitting: Urology

## 2017-02-10 DIAGNOSIS — N200 Calculus of kidney: Secondary | ICD-10-CM | POA: Diagnosis not present

## 2017-07-09 ENCOUNTER — Other Ambulatory Visit: Payer: Self-pay | Admitting: Urology

## 2017-07-09 DIAGNOSIS — N200 Calculus of kidney: Secondary | ICD-10-CM

## 2017-07-13 ENCOUNTER — Ambulatory Visit (HOSPITAL_COMMUNITY)
Admission: RE | Admit: 2017-07-13 | Discharge: 2017-07-13 | Disposition: A | Discharge status: Home / Self Care | Payer: BLUE CROSS/BLUE SHIELD | Source: Ambulatory Visit | Attending: Urology | Admitting: Urology

## 2017-07-13 DIAGNOSIS — N200 Calculus of kidney: Secondary | ICD-10-CM | POA: Insufficient documentation

## 2017-07-14 ENCOUNTER — Ambulatory Visit (INDEPENDENT_AMBULATORY_CARE_PROVIDER_SITE_OTHER): Payer: BLUE CROSS/BLUE SHIELD | Admitting: Urology

## 2017-07-14 DIAGNOSIS — N200 Calculus of kidney: Secondary | ICD-10-CM | POA: Diagnosis not present

## 2018-01-12 ENCOUNTER — Other Ambulatory Visit: Payer: Self-pay | Admitting: Urology

## 2018-01-12 ENCOUNTER — Ambulatory Visit (HOSPITAL_COMMUNITY)
Admission: RE | Admit: 2018-01-12 | Discharge: 2018-01-12 | Disposition: A | Discharge status: Home / Self Care | Payer: BLUE CROSS/BLUE SHIELD | Source: Ambulatory Visit | Attending: Urology | Admitting: Urology

## 2018-01-12 ENCOUNTER — Ambulatory Visit (INDEPENDENT_AMBULATORY_CARE_PROVIDER_SITE_OTHER): Payer: BLUE CROSS/BLUE SHIELD | Admitting: Urology

## 2018-01-12 DIAGNOSIS — K5900 Constipation, unspecified: Secondary | ICD-10-CM | POA: Diagnosis not present

## 2018-01-12 DIAGNOSIS — N200 Calculus of kidney: Secondary | ICD-10-CM

## 2018-04-08 IMAGING — DX DG ABDOMEN 1V
1 series · 1 of 1 positions shown · non-contrast
Comparison: December 27, 2015

CLINICAL DATA: Preoperative lithotripsy

EXAM:
ABDOMEN - 1 VIEW

[abdomen kub]
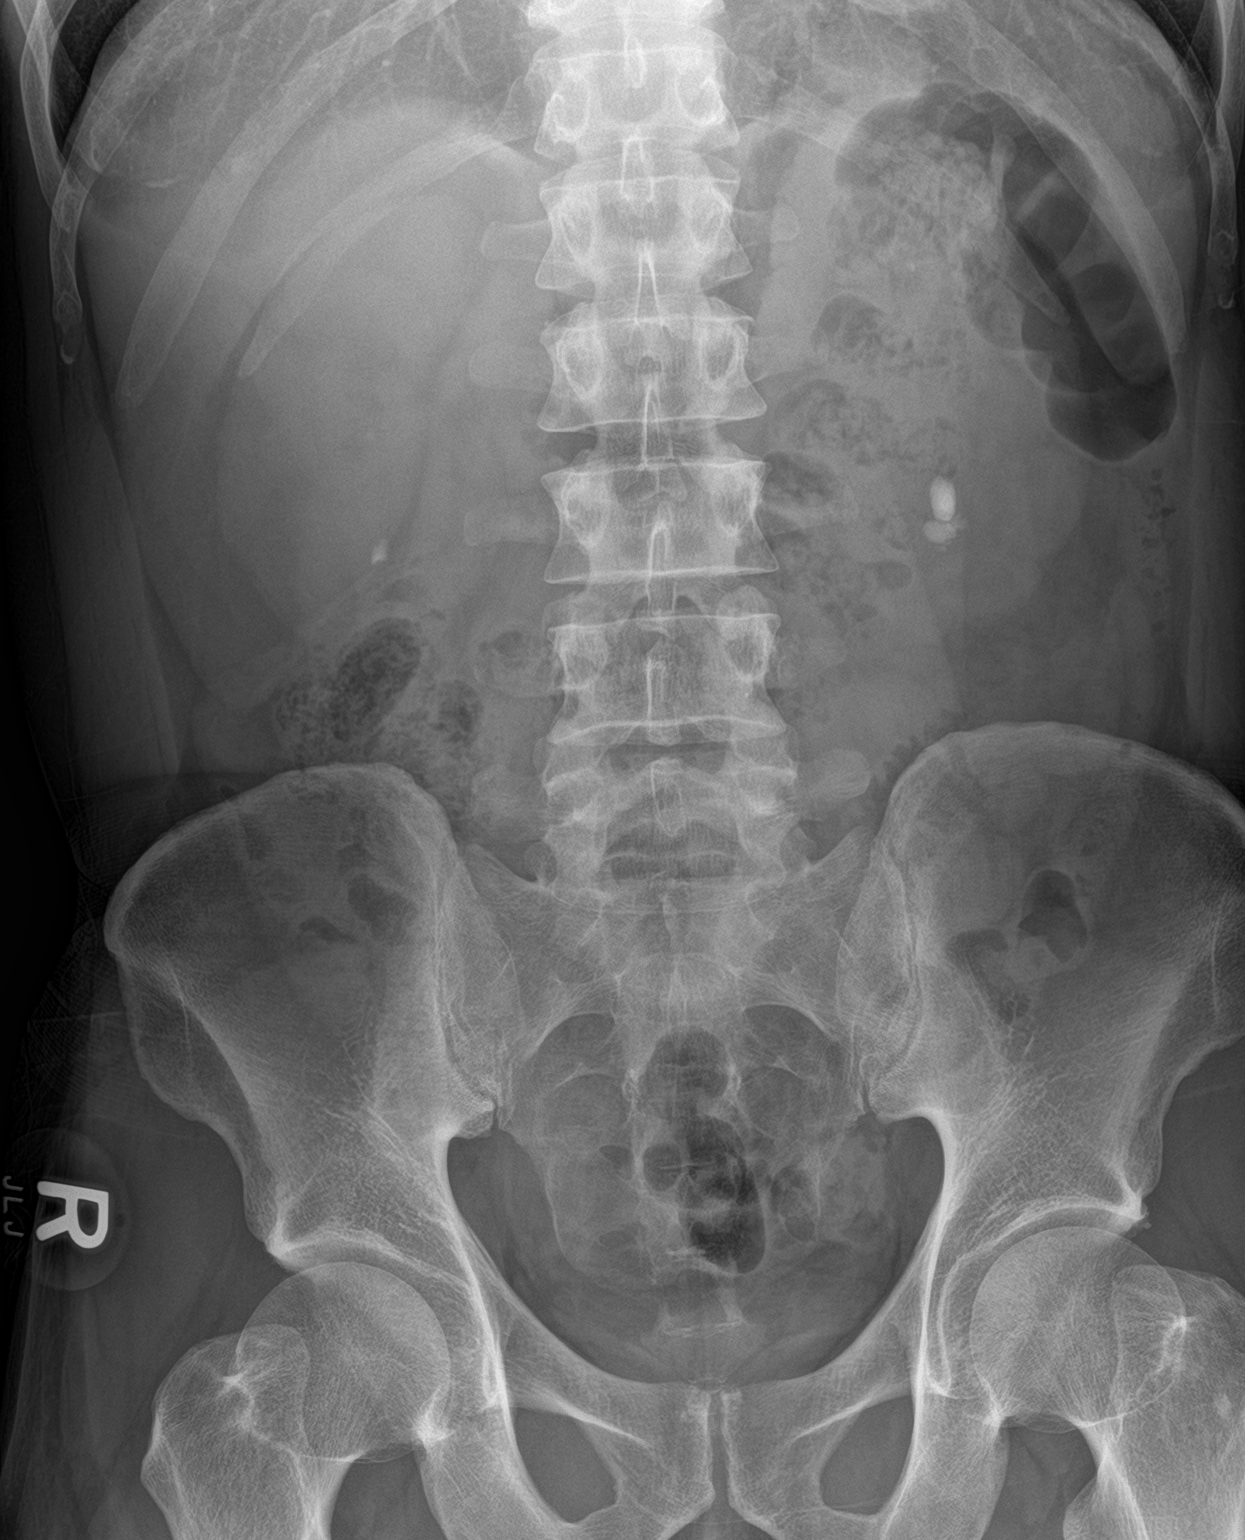

[1 of 1 positions shown; findings below may reference images not displayed]

FINDINGS: There are calculi in the lower pole of the left kidney. The largest
individual calculus in the lower pole left kidney measures 1.1 x
cm. There is a nearby calculus in the lower pole left kidney
measuring 0.8 x 0.5 cm. There are nearby calculi measuring between 1
and 4 mm. There is a calculus along the inferior medial aspect of
the right kidney measuring 0.7 x 0.5 cm. There are multiple
prostatic calculi. The bowel gas pattern is normal. There is
moderate stool throughout the colon. No bowel obstruction or free
air.
IMPRESSION: Renal calcifications, primarily on the left as noted above. Largest
individual calculus is in the left lower pole region measuring 1.1 x
0.6 cm. There is a nearby 0.8 x 0.5 cm calculus in this area on the
left.

## 2018-05-08 IMAGING — DX DG ABDOMEN 1V
1 series · 1 of 1 positions shown · non-contrast
Comparison: 01/22/2016.

CLINICAL DATA: Renal calculus.  Prior lithotripsy .

EXAM:
ABDOMEN - 1 VIEW

[abdomen kub]
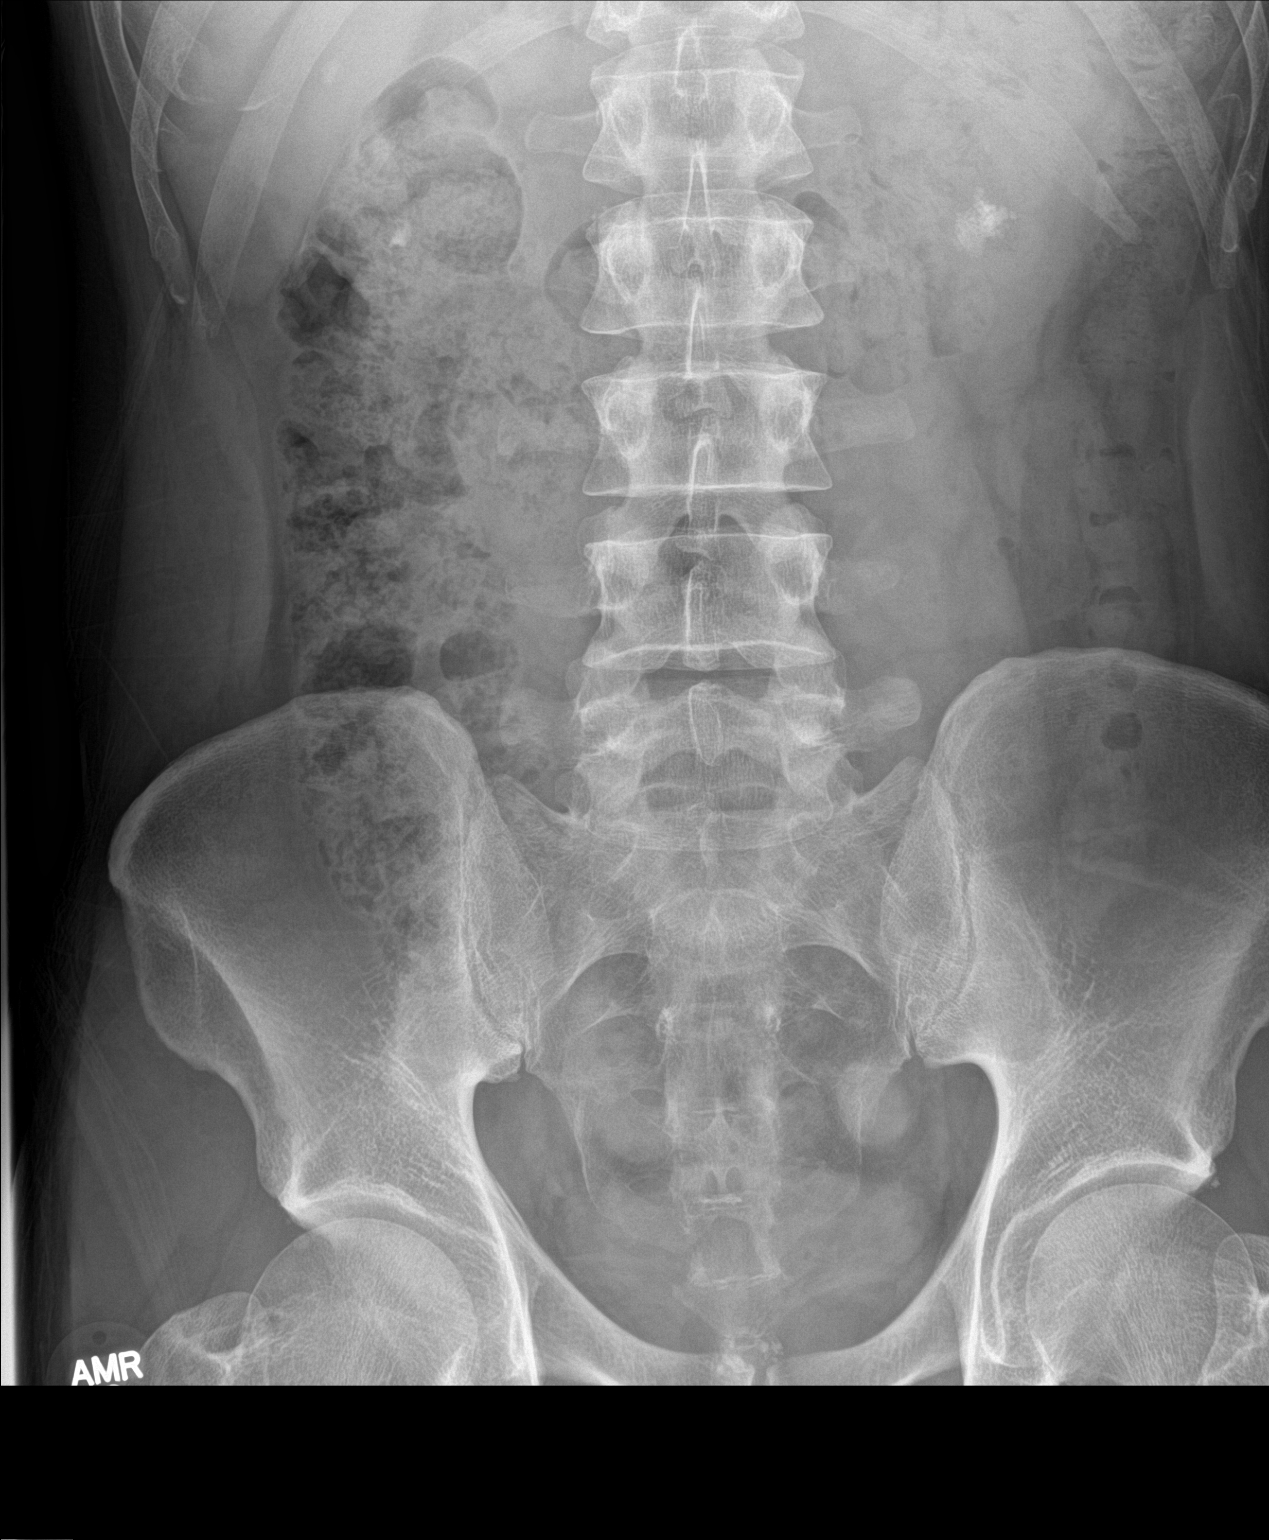

[1 of 1 positions shown; findings below may reference images not displayed]

FINDINGS: Stable left renal stone fragments. Stable right nephrolithiasis. No
evidence of urolithiasis. Prostate calcifications versus small stone
fragments in the bladder. No bowel distention. No acute bony
abnormality .
IMPRESSION: 1. Stable position of left lower renal pole stone fragments. Stable
right nephrolithiasis. No evidence of urolithiasis.

2. Prostate calcifications versus tiny stone fragments in the
bladder .

## 2018-06-24 ENCOUNTER — Other Ambulatory Visit: Payer: Self-pay

## 2018-06-24 ENCOUNTER — Emergency Department (HOSPITAL_COMMUNITY)
Admission: EM | Admit: 2018-06-24 | Discharge: 2018-06-24 | Disposition: A | Discharge status: Home / Self Care | Payer: BLUE CROSS/BLUE SHIELD | Attending: Emergency Medicine | Admitting: Emergency Medicine

## 2018-06-24 ENCOUNTER — Emergency Department (HOSPITAL_COMMUNITY): Payer: BLUE CROSS/BLUE SHIELD

## 2018-06-24 ENCOUNTER — Encounter (HOSPITAL_COMMUNITY): Payer: Self-pay

## 2018-06-24 DIAGNOSIS — R42 Dizziness and giddiness: Secondary | ICD-10-CM | POA: Insufficient documentation

## 2018-06-24 DIAGNOSIS — Z79899 Other long term (current) drug therapy: Secondary | ICD-10-CM | POA: Diagnosis not present

## 2018-06-24 MED ORDER — MECLIZINE HCL 12.5 MG PO TABS
25.0000 mg | ORAL_TABLET | Freq: Once | ORAL | Status: AC
  Administered 2018-06-24: 25 mg via ORAL
  Filled 2018-06-24: qty 2

## 2018-06-24 MED ORDER — MECLIZINE HCL 25 MG PO TABS: 25.0000 mg | ORAL_TABLET | Freq: Three times a day (TID) | ORAL | 0 refills | Status: DC | PRN

## 2018-06-24 NOTE — ED Notes (Signed)
Alfredo Martinezmanda Vallie wife.  [phone]

## 2018-06-24 NOTE — ED Provider Notes (Signed)
[facility] Provider Note   CSN: [id number] Arrival date & time: 06/24/18  1420     History   Chief Complaint Chief Complaint  Patient presents with  . Dizziness    post head injury     HPI Brendan Bowman is a 43 y.o. male.  Patient reports room spinning vigorously after standing up last evening at approximately 2030.  He did bump his head on the crown yesterday at work.  No motor or sensory deficits, facial asymmetry, stiff neck, visual changes, dysphasia.  He is normally healthy.  Severity is mild.  Standing and walking makes symptoms worse.     Past Medical History:  Diagnosis Date  . History of kidney stones   . IBS (irritable bowel syndrome)   . Seasonal allergies     There are no active problems to display for this patient.   Past Surgical History:  Procedure Laterality Date  . COLONOSCOPY     For IBS, No polyps  . CYSTOSCOPY WITH STENT PLACEMENT Bilateral 02/19/2016   Procedure: CYSTOSCOPY WITH BILATERAL URETERAL STENT PLACEMENT;  Surgeon: Malen GauzePatrick L McKenzie, MD;  Location: [facility];  Service: Urology;  Laterality: Bilateral;  . CYSTOSCOPY/RETROGRADE/URETEROSCOPY/STONE EXTRACTION WITH BASKET Bilateral 02/19/2016   Procedure: CYSTOSCOPY/BILATERAL RETROGRADE PYELOGRAM/ BILATERAL STONE EXTRACTION WITH BASKET;  Surgeon: Malen GauzePatrick L McKenzie, MD;  Location: [facility];  Service: Urology;  Laterality: Bilateral;  . HOLMIUM LASER APPLICATION N/A 02/19/2016   Procedure: HOLMIUM LASER APPLICATION;  Surgeon: Malen GauzePatrick L McKenzie, MD;  Location: [facility];  Service: Urology;  Laterality: N/A;  . LITHOTRIPSY    . MYRINGOTOMY Bilateral as a child  . VASECTOMY  early 2017        Home Medications    Prior to Admission medications   Medication Sig Start Date End Date Taking? Authorizing Provider  colestipol (COLESTID) 1 g tablet Take 2 g by mouth 3 (three) times daily. Before meals.    [provider]  diphenhydrAMINE (BENADRYL) 25 MG tablet Take 25 mg by  mouth at bedtime as needed for allergies.    [provider]  fluticasone (FLONASE) 50 MCG/ACT nasal spray Place 1 spray into both nostrils daily.    [provider]  ibuprofen (ADVIL,MOTRIN) 200 MG tablet Take 800 mg by mouth every 6 (six) hours as needed for moderate pain.    [provider]  ketorolac (TORADOL) 10 MG tablet Take 1 tablet (10 mg total) by mouth every 8 (eight) hours as needed. 02/19/16   McKenzie, Mardene CelestePatrick L, MD  meclizine (ANTIVERT) 25 MG tablet Take 1 tablet (25 mg total) by mouth 3 (three) times daily as needed for dizziness. 06/24/18   Donnetta Hutchingook, Deyani Hegarty, MD  oxyCODONE-acetaminophen (PERCOCET/ROXICET) 5-325 MG tablet Take 1 tablet by mouth every 6 (six) hours as needed. 02/19/16   McKenzie, Mardene CelestePatrick L, MD  sulfamethoxazole-trimethoprim (BACTRIM DS,SEPTRA DS) 800-160 MG tablet Take 1 tablet by mouth 2 (two) times daily. 02/19/16   McKenzie, Mardene CelestePatrick L, MD  tamsulosin (FLOMAX) 0.4 MG CAPS capsule Take 1 capsule (0.4 mg total) by mouth daily. 02/19/16   McKenzie, Mardene CelestePatrick L, MD    Family History History reviewed. No pertinent family history.  Social History Social History   Tobacco Use  . Smoking status: Never Smoker  . Smokeless tobacco: Never Used  Substance Use Topics  . Alcohol use: No  . Drug use: No     Allergies   Patient has no known allergies.   Review of Systems Review of Systems  All other  systems reviewed and are negative.    Physical Exam Updated Vital Signs BP (!) 134/94 (BP Location: Left Arm)   Pulse 75   Temp 98.4 F (36.9 C) (Oral)   Resp 18   Ht 5\' 7"  (1.702 m)   Wt 72.6 kg   SpO2 100%   BMI 25.06 kg/m   Physical Exam Vitals signs and nursing note reviewed.  Constitutional:      Appearance: He is well-developed.  HENT:     Head: Normocephalic and atraumatic.  Eyes:     Conjunctiva/sclera: Conjunctivae normal.  Neck:     Musculoskeletal: Neck supple.  Cardiovascular:     Rate and Rhythm: Normal rate and  regular rhythm.  Pulmonary:     Effort: Pulmonary effort is normal.     Breath sounds: Normal breath sounds.  Abdominal:     General: Bowel sounds are normal.     Palpations: Abdomen is soft.  Musculoskeletal: Normal range of motion.  Skin:    General: Skin is warm and dry.  Neurological:     Mental Status: He is alert and oriented to person, place, and time.     Comments: No gross motor or sensory deficit.  No obvious ataxia  Psychiatric:        Behavior: Behavior normal.      ED Treatments / Results  Labs (all labs ordered are listed, but only abnormal results are displayed) Labs Reviewed - No data to display  EKG None  Radiology Ct Head Wo Contrast  Result Date: 06/24/2018 CLINICAL DATA:  Head injury at work yesterday. Dizziness. Fall yesterday. EXAM: CT HEAD WITHOUT CONTRAST CT CERVICAL SPINE WITHOUT CONTRAST TECHNIQUE: Multidetector CT imaging of the head and cervical spine was performed following the standard protocol without intravenous contrast. Multiplanar CT image reconstructions of the cervical spine were also generated. COMPARISON:  None. FINDINGS: CT HEAD FINDINGS Brain: No evidence of acute infarction, hemorrhage, hydrocephalus, extra-axial collection or mass lesion/mass effect. Vascular: Negative for hyperdense vessel Skull: Negative for skull fracture Sinuses/Orbits: Mild mucosal edema paranasal sinuses.  Normal orbit Other: None CT CERVICAL SPINE FINDINGS Alignment: Normal Skull base and vertebrae: Negative for fracture Soft tissues and spinal canal: Negative Disc levels: Mild disc degeneration and minimal spurring at C4-5 and C5-6. Upper chest: Negative Other: None IMPRESSION: Negative CT head and cervical spine.  No acute abnormality. Electronically Signed   By: Marlan Palau M.D.   On: 06/24/2018 15:16   Ct Cervical Spine Wo Contrast  Result Date: 06/24/2018 CLINICAL DATA:  Head injury at work yesterday. Dizziness. Fall yesterday. EXAM: CT HEAD WITHOUT CONTRAST  CT CERVICAL SPINE WITHOUT CONTRAST TECHNIQUE: Multidetector CT imaging of the head and cervical spine was performed following the standard protocol without intravenous contrast. Multiplanar CT image reconstructions of the cervical spine were also generated. COMPARISON:  None. FINDINGS: CT HEAD FINDINGS Brain: No evidence of acute infarction, hemorrhage, hydrocephalus, extra-axial collection or mass lesion/mass effect. Vascular: Negative for hyperdense vessel Skull: Negative for skull fracture Sinuses/Orbits: Mild mucosal edema paranasal sinuses.  Normal orbit Other: None CT CERVICAL SPINE FINDINGS Alignment: Normal Skull base and vertebrae: Negative for fracture Soft tissues and spinal canal: Negative Disc levels: Mild disc degeneration and minimal spurring at C4-5 and C5-6. Upper chest: Negative Other: None IMPRESSION: Negative CT head and cervical spine.  No acute abnormality. Electronically Signed   By: Marlan Palau M.D.   On: 06/24/2018 15:16    Procedures Procedures (including critical care time)  Medications Ordered in ED Medications  meclizine (ANTIVERT) tablet 25 mg (25 mg Oral Given 06/24/18 1633)     Initial Impression / Assessment and Plan / ED Course  I have reviewed the triage vital signs and the nursing notes.  Pertinent labs & imaging results that were available during my care of the patient were reviewed by me and considered in my medical decision making (see chart for details).    History and physical most consistent with benign positional vertigo.  CT head and CT cervical spine negative for acute pathology.  Discharge medication Antivert 25 mg.  Discussed with the patient and his wife who is an [employer] fundraiser.  Final Clinical Impressions(s) / ED Diagnoses   Final diagnoses:  Vertigo    ED Discharge Orders         Ordered    meclizine (ANTIVERT) 25 MG tablet  3 times daily PRN     06/24/18 1633           Donnetta Hutching, MD 06/24/18 2000

## 2018-06-24 NOTE — ED Triage Notes (Signed)
Pt reports hitting head at work yesterday morning around 10 am. States no symptoms until last night around 2030 and were present when he went to bed. Has continued to have dizziness, "feeling off", balance is off/unsteady and patient reports that he fell multiple times yesterday.

## 2018-06-24 NOTE — Discharge Instructions (Signed)
CT scan of head and neck were normal.  Rest.  Medicine for vertigo.

## 2018-06-24 NOTE — ED Notes (Signed)
Hit head yesterday about 10 am on rail.  Denies any pain or discomfort at that time.  Started with dizziness at 2030 and gradually got worse.  Room started spinning.

## 2018-07-27 ENCOUNTER — Other Ambulatory Visit (HOSPITAL_COMMUNITY): Payer: Self-pay | Admitting: Urology

## 2018-07-27 ENCOUNTER — Ambulatory Visit (INDEPENDENT_AMBULATORY_CARE_PROVIDER_SITE_OTHER): Payer: BLUE CROSS/BLUE SHIELD | Admitting: Urology

## 2018-07-27 ENCOUNTER — Ambulatory Visit (HOSPITAL_COMMUNITY)
Admission: RE | Admit: 2018-07-27 | Discharge: 2018-07-27 | Disposition: A | Discharge status: Home / Self Care | Payer: BLUE CROSS/BLUE SHIELD | Source: Ambulatory Visit | Attending: Urology | Admitting: Urology

## 2018-07-27 DIAGNOSIS — N200 Calculus of kidney: Secondary | ICD-10-CM

## 2020-02-01 DIAGNOSIS — Z23 Encounter for immunization: Secondary | ICD-10-CM | POA: Diagnosis not present

## 2020-02-19 ENCOUNTER — Other Ambulatory Visit: Payer: Self-pay | Admitting: Urology

## 2020-02-19 DIAGNOSIS — E291 Testicular hypofunction: Secondary | ICD-10-CM

## 2020-03-05 DIAGNOSIS — Z23 Encounter for immunization: Secondary | ICD-10-CM | POA: Diagnosis not present

## 2020-03-14 ENCOUNTER — Ambulatory Visit (INDEPENDENT_AMBULATORY_CARE_PROVIDER_SITE_OTHER): Payer: 59 | Admitting: Urology

## 2020-03-14 ENCOUNTER — Encounter: Payer: Self-pay | Admitting: Urology

## 2020-03-14 ENCOUNTER — Other Ambulatory Visit: Payer: Self-pay

## 2020-03-14 VITALS — BP 135/87 | HR 76 | Ht 67.0 in | Wt 160.0 lb

## 2020-03-14 DIAGNOSIS — R351 Nocturia: Secondary | ICD-10-CM | POA: Insufficient documentation

## 2020-03-14 DIAGNOSIS — N200 Calculus of kidney: Secondary | ICD-10-CM | POA: Diagnosis not present

## 2020-03-14 DIAGNOSIS — E291 Testicular hypofunction: Secondary | ICD-10-CM | POA: Diagnosis not present

## 2020-03-14 DIAGNOSIS — N401 Enlarged prostate with lower urinary tract symptoms: Secondary | ICD-10-CM | POA: Insufficient documentation

## 2020-03-14 DIAGNOSIS — N138 Other obstructive and reflux uropathy: Secondary | ICD-10-CM

## 2020-03-14 LAB — URINALYSIS, ROUTINE W REFLEX MICROSCOPIC
Bilirubin, UA: NEGATIVE
Glucose, UA: NEGATIVE
Ketones, UA: NEGATIVE
Leukocytes,UA: NEGATIVE
Nitrite, UA: NEGATIVE
Protein,UA: NEGATIVE
RBC, UA: NEGATIVE
Specific Gravity, UA: 1.01 (ref 1.005–1.030)
Urobilinogen, Ur: 0.2 mg/dL (ref 0.2–1.0)
pH, UA: 5.5 (ref 5.0–7.5)

## 2020-03-14 LAB — BLADDER SCAN AMB NON-IMAGING: Scan Result: 91

## 2020-03-14 MED ORDER — ALFUZOSIN HCL ER 10 MG PO TB24: 10.0000 mg | ORAL_TABLET | Freq: Every day | ORAL | 11 refills | Status: DC

## 2020-03-14 NOTE — Patient Instructions (Signed)

## 2020-03-14 NOTE — Progress Notes (Signed)
91Urological Symptom Review  Patient is experiencing the following symptoms: Get up at night to urinate   Review of Systems  Gastrointestinal (upper)  : Negative for upper GI symptoms  Gastrointestinal (lower) : Negative for lower GI symptoms  Constitutional : Negative for symptoms  Skin: Negative for skin symptoms  Eyes: Negative for eye symptoms  Ear/Nose/Throat : Negative for Ear/Nose/Throat symptoms  Hematologic/Lymphatic: Negative for Hematologic/Lymphatic symptoms  Cardiovascular : Negative for cardiovascular symptoms  Respiratory : Negative for respiratory symptoms  Endocrine: Negative for endocrine symptoms  Musculoskeletal: Negative for musculoskeletal symptoms  Neurological: Negative for neurological symptoms  Psychologic: Negative for psychiatric symptoms

## 2020-03-14 NOTE — Progress Notes (Signed)
03/14/2020 8:49 AM   Brendan Bowman [birth year] [id number]  Referring provider: Aniceto Boss, MD [address] [place],  [place] [address]  nocturia  HPI: [name] is a 44yo here for evaluation of nocturia, fatigue, and nephrolithiasis. He notes increased fatigue in late afternoons over the past year. He does not work out as much as he used to. He used anabolic steroids in his 27s. He sleeps 6-7 hours total. He drinks 8oz within 2 hours of bed. Nocturia 1x. He was on flomax 0.4mg  which improved his urinary stream but he has not taken it in over 6 months. No urinary urgency, frequency, dribbling, or feeling incomplete emptying. No stone events recently.    PMH: Past Medical History:  Diagnosis Date  . History of kidney stones   . IBS (irritable bowel syndrome)   . Seasonal allergies     Surgical History: Past Surgical History:  Procedure Laterality Date  . COLONOSCOPY     For IBS, No polyps  . CYSTOSCOPY WITH STENT PLACEMENT Bilateral 02/19/2016   Procedure: CYSTOSCOPY WITH BILATERAL URETERAL STENT PLACEMENT;  Surgeon: Brendan Gauze, MD;  Location: [facility];  Service: Urology;  Laterality: Bilateral;  . CYSTOSCOPY/RETROGRADE/URETEROSCOPY/STONE EXTRACTION WITH BASKET Bilateral 02/19/2016   Procedure: CYSTOSCOPY/BILATERAL RETROGRADE PYELOGRAM/ BILATERAL STONE EXTRACTION WITH BASKET;  Surgeon: Brendan Gauze, MD;  Location: [facility];  Service: Urology;  Laterality: Bilateral;  . HOLMIUM LASER APPLICATION N/A 02/19/2016   Procedure: HOLMIUM LASER APPLICATION;  Surgeon: Brendan Gauze, MD;  Location: [facility];  Service: Urology;  Laterality: N/A;  . LITHOTRIPSY    . MYRINGOTOMY Bilateral as a child  . VASECTOMY  early 2017    Home Medications:  Allergies as of 03/14/2020   No Known Allergies     Medication List       Accurate as of March 14, 2020  8:49 AM. If you have any questions, ask your nurse or doctor.        colestipol 1 g tablet Commonly known as: COLESTID Take 2 g  by mouth 3 (three) times daily. Before meals.   diphenhydrAMINE 25 MG tablet Commonly known as: BENADRYL Take 25 mg by mouth at bedtime as needed for allergies.   fluticasone 50 MCG/ACT nasal spray Commonly known as: FLONASE Place 1 spray into both nostrils daily.   ibuprofen 200 MG tablet Commonly known as: ADVIL Take 800 mg by mouth every 6 (six) hours as needed for moderate pain.   ketorolac 10 MG tablet Commonly known as: TORADOL Take 1 tablet (10 mg total) by mouth every 8 (eight) hours as needed.   meclizine 25 MG tablet Commonly known as: ANTIVERT Take 1 tablet (25 mg total) by mouth 3 (three) times daily as needed for dizziness.   oxyCODONE-acetaminophen 5-325 MG tablet Commonly known as: PERCOCET/ROXICET Take 1 tablet by mouth every 6 (six) hours as needed.   sulfamethoxazole-trimethoprim 800-160 MG tablet Commonly known as: BACTRIM DS Take 1 tablet by mouth 2 (two) times daily.   tamsulosin 0.4 MG Caps capsule Commonly known as: FLOMAX Take 1 capsule (0.4 mg total) by mouth daily.       Allergies: No Known Allergies  Family History: No family history on file.  Social History:  reports that he has never smoked. He has never used smokeless tobacco. He reports that he does not drink alcohol and does not use drugs.  ROS: All other review of systems were reviewed and are negative except what is noted above in HPI  Physical  Exam: BP 135/87   Pulse 76   Ht 5\' 7"  (1.702 m)   Wt 160 lb (72.6 kg)   BMI 25.06 kg/m   Constitutional:  Alert and oriented, No acute distress. HEENT: [place] AT, moist mucus membranes.  Trachea midline, no masses. Cardiovascular: No clubbing, cyanosis, or edema. Respiratory: Normal respiratory effort, no increased work of breathing. GI: Abdomen is soft, nontender, nondistended, no abdominal masses GU: No CVA tenderness. Circumcised phallus. No masses/lesions on penis, testis, scrotum. Prostate 40g smooth no nodules no induration.    Lymph: No cervical or inguinal lymphadenopathy. Skin: No rashes, bruises or suspicious lesions. Neurologic: Grossly intact, no focal deficits, moving all 4 extremities. Psychiatric: Normal mood and affect.  Laboratory Data: Lab Results  Component Value Date   WBC 9.3 02/17/2016   HGB 15.4 02/17/2016   HCT 45.2 02/17/2016   MCV 90.0 02/17/2016   PLT 234 02/17/2016    Lab Results  Component Value Date   CREATININE 0.85 02/17/2016    No results found for: PSA  No results found for: TESTOSTERONE  No results found for: HGBA1C  Urinalysis No results found for: COLORURINE, APPEARANCEUR, LABSPEC, PHURINE, GLUCOSEU, HGBUR, BILIRUBINUR, KETONESUR, PROTEINUR, UROBILINOGEN, NITRITE, LEUKOCYTESUR  No results found for: LABMICR, WBCUA, RBCUA, LABEPIT, MUCUS, BACTERIA  Pertinent Imaging:  Results for orders placed during the hospital encounter of 07/27/18  DG Abd 1 View  Narrative CLINICAL DATA:  Followup renal calculi.  EXAM: ABDOMEN - 1 VIEW  COMPARISON:  01/12/2018  FINDINGS: Small lower pole stone fragments are noted. No other definite renal calculi. No definite ureteral or bladder calculi.  The bowel gas pattern is unremarkable. The bony structures are intact.  IMPRESSION: Stable small stone fragments in the lower pole region of the left kidney.   Electronically Signed By: 03/14/2018 M.D. On: 07/27/2018 15:59  No results found for this or any previous visit.  No results found for this or any previous visit.  No results found for this or any previous visit.  Results for orders placed during the hospital encounter of 07/13/17  09/10/17 RENAL  Narrative CLINICAL DATA:  Follow-up renal calculus  EXAM: RENAL / URINARY TRACT ULTRASOUND COMPLETE  COMPARISON:  08/25/2016 renal ultrasound, 01/01/2017 plain film examination  FINDINGS: Right Kidney:  Length: 13.6 cm. Echogenicity within normal limits. No mass or hydronephrosis visualized.  Left  Kidney:  Length: 12.3 cm. Lower pole calculi are noted similar to that seen on the prior plain film examination. They have progressed somewhat in the interval from the prior ultrasound. No obstructive changes are noted.  Bladder:  Appears normal for degree of bladder distention.  IMPRESSION: Left renal calculi without obstructive change.   Electronically Signed By: 01/03/2017 M.D. On: 07/13/2017 16:27  No results found for this or any previous visit.  No results found for this or any previous visit.  No results found for this or any previous visit.   Assessment & Plan:    1. Kidney stones -observation  2. Hypogonadism in male -testosterone labs,  - Urinalysis, Routine w reflex microscopic  3. Nocturia -We will trial uroxatral 10mg  qhs - BLADDER SCAN AMB NON-IMAGING   No follow-ups on file.  09/10/2017, MD  [facility] [place]

## 2020-03-15 ENCOUNTER — Other Ambulatory Visit: Payer: Self-pay

## 2020-03-15 MED ORDER — TAMSULOSIN HCL 0.4 MG PO CAPS: 0.4000 mg | ORAL_CAPSULE | Freq: Every day | ORAL | 11 refills | Status: DC

## 2020-03-15 NOTE — Addendum Note (Signed)
Addended by: Malen Gauze on: 03/15/2020 10:26 AM   Modules accepted: Orders

## 2020-03-19 LAB — FSH/LH
FSH: 5.2 m[IU]/mL (ref 1.5–12.4)
LH: 4.8 m[IU]/mL (ref 1.7–8.6)

## 2020-03-19 LAB — CBC WITH DIFFERENTIAL
Basophils Absolute: 0.1 10*3/uL (ref 0.0–0.2)
Basos: 1 %
EOS (ABSOLUTE): 0.3 10*3/uL (ref 0.0–0.4)
Eos: 4 %
Hematocrit: 47.5 % (ref 37.5–51.0)
Hemoglobin: 16 g/dL (ref 13.0–17.7)
Immature Grans (Abs): 0.1 10*3/uL (ref 0.0–0.1)
Immature Granulocytes: 1 %
Lymphocytes Absolute: 2.7 10*3/uL (ref 0.7–3.1)
Lymphs: 34 %
MCH: 30.5 pg (ref 26.6–33.0)
MCHC: 33.7 g/dL (ref 31.5–35.7)
MCV: 91 fL (ref 79–97)
Monocytes Absolute: 1 10*3/uL — ABNORMAL HIGH (ref 0.1–0.9)
Monocytes: 12 %
Neutrophils Absolute: 3.9 10*3/uL (ref 1.4–7.0)
Neutrophils: 48 %
RBC: 5.24 x10E6/uL (ref 4.14–5.80)
RDW: 12.4 % (ref 11.6–15.4)
WBC: 8 10*3/uL (ref 3.4–10.8)

## 2020-03-19 LAB — COMPREHENSIVE METABOLIC PANEL
ALT: 58 IU/L — ABNORMAL HIGH (ref 0–44)
AST: 40 IU/L (ref 0–40)
Albumin/Globulin Ratio: 2.4 — ABNORMAL HIGH (ref 1.2–2.2)
Albumin: 4.8 g/dL (ref 4.0–5.0)
Alkaline Phosphatase: 101 IU/L (ref 44–121)
BUN/Creatinine Ratio: 15 (ref 9–20)
BUN: 13 mg/dL (ref 6–24)
Bilirubin Total: 0.6 mg/dL (ref 0.0–1.2)
CO2: 23 mmol/L (ref 20–29)
Calcium: 9.5 mg/dL (ref 8.7–10.2)
Chloride: 103 mmol/L (ref 96–106)
Creatinine, Ser: 0.84 mg/dL (ref 0.76–1.27)
GFR calc Af Amer: 122 mL/min/{1.73_m2} (ref >59)
GFR calc non Af Amer: 106 mL/min/{1.73_m2} (ref >59)
Globulin, Total: 2 g/dL (ref 1.5–4.5)
Glucose: 81 mg/dL (ref 65–99)
Potassium: 4.3 mmol/L (ref 3.5–5.2)
Sodium: 141 mmol/L (ref 134–144)
Total Protein: 6.8 g/dL (ref 6.0–8.5)

## 2020-03-19 LAB — TESTOSTERONE,FREE AND TOTAL
Testosterone, Free: 7.6 pg/mL (ref 6.8–21.5)
Testosterone: 502 ng/dL (ref 264–916)

## 2020-03-19 LAB — ESTRADIOL: Estradiol: 5.9 pg/mL — ABNORMAL LOW (ref 7.6–42.6)

## 2020-03-19 LAB — PROLACTIN: Prolactin: 8.9 ng/mL (ref 4.0–15.2)

## 2020-05-09 ENCOUNTER — Other Ambulatory Visit: Payer: Self-pay

## 2020-05-09 MED ORDER — TAMSULOSIN HCL 0.4 MG PO CAPS: 0.4000 mg | ORAL_CAPSULE | Freq: Every day | ORAL | 3 refills | Status: DC

## 2020-05-09 MED FILL — TAMSULOSIN HCL 0.4 MG CAP: 0.4 | 90 days supply | Qty: 90 | Fill #0

## 2020-06-13 ENCOUNTER — Ambulatory Visit: Payer: 59 | Admitting: Urology

## 2020-06-14 ENCOUNTER — Ambulatory Visit: Payer: 59 | Admitting: Urology

## 2020-07-22 ENCOUNTER — Ambulatory Visit: Payer: 59 | Admitting: Urology

## 2020-07-23 ENCOUNTER — Other Ambulatory Visit (HOSPITAL_COMMUNITY): Payer: Self-pay | Admitting: Family Medicine

## 2020-07-23 DIAGNOSIS — Z23 Encounter for immunization: Secondary | ICD-10-CM | POA: Diagnosis not present

## 2020-07-23 DIAGNOSIS — K589 Irritable bowel syndrome without diarrhea: Secondary | ICD-10-CM | POA: Diagnosis not present

## 2020-08-13 MED FILL — TAMSULOSIN HCL 0.4 MG CAP: 0.4 | 90 days supply | Qty: 90 | Fill #1

## 2020-08-13 MED FILL — COLESTIPOL HCL 1 GM TABLET: 1 | 90 days supply | Qty: 540 | Fill #0

## 2020-08-28 ENCOUNTER — Other Ambulatory Visit (HOSPITAL_BASED_OUTPATIENT_CLINIC_OR_DEPARTMENT_OTHER): Payer: Self-pay

## 2020-08-30 ENCOUNTER — Other Ambulatory Visit (HOSPITAL_BASED_OUTPATIENT_CLINIC_OR_DEPARTMENT_OTHER): Payer: Self-pay

## 2020-09-11 ENCOUNTER — Encounter: Payer: Self-pay | Admitting: Urology

## 2020-09-11 ENCOUNTER — Other Ambulatory Visit (HOSPITAL_COMMUNITY): Payer: Self-pay

## 2020-09-11 ENCOUNTER — Other Ambulatory Visit: Payer: Self-pay

## 2020-09-11 ENCOUNTER — Ambulatory Visit (INDEPENDENT_AMBULATORY_CARE_PROVIDER_SITE_OTHER): Payer: 59 | Admitting: Urology

## 2020-09-11 VITALS — BP 137/90 | HR 80 | Temp 98.1°F | Resp 12 | Ht 67.0 in | Wt 165.0 lb

## 2020-09-11 DIAGNOSIS — N401 Enlarged prostate with lower urinary tract symptoms: Secondary | ICD-10-CM | POA: Diagnosis not present

## 2020-09-11 DIAGNOSIS — N138 Other obstructive and reflux uropathy: Secondary | ICD-10-CM

## 2020-09-11 DIAGNOSIS — R351 Nocturia: Secondary | ICD-10-CM | POA: Diagnosis not present

## 2020-09-11 DIAGNOSIS — E291 Testicular hypofunction: Secondary | ICD-10-CM | POA: Diagnosis not present

## 2020-09-11 DIAGNOSIS — N200 Calculus of kidney: Secondary | ICD-10-CM

## 2020-09-11 LAB — URINALYSIS, ROUTINE W REFLEX MICROSCOPIC
Bilirubin, UA: NEGATIVE
Glucose, UA: NEGATIVE
Ketones, UA: NEGATIVE
Leukocytes,UA: NEGATIVE
Nitrite, UA: NEGATIVE
Protein,UA: NEGATIVE
Specific Gravity, UA: 1.02 (ref 1.005–1.030)
Urobilinogen, Ur: 0.2 mg/dL (ref 0.2–1.0)
pH, UA: 7 (ref 5.0–7.5)

## 2020-09-11 LAB — MICROSCOPIC EXAMINATION
Bacteria, UA: NONE SEEN
Epithelial Cells (non renal): NONE SEEN /hpf (ref 0–10)
Renal Epithel, UA: NONE SEEN /hpf
WBC, UA: NONE SEEN /hpf (ref 0–5)

## 2020-09-11 MED ORDER — TAMSULOSIN HCL 0.4 MG PO CAPS
ORAL_CAPSULE | Freq: Every day | ORAL | 3 refills | Status: DC
  Filled 2020-09-11 – 2020-11-11 (×2): qty 90, 90d supply, fill #0
  Filled 2021-02-11: qty 90, 90d supply, fill #1
  Filled 2021-05-15: qty 90, 90d supply, fill #2
  Filled 2021-08-15: qty 90, 90d supply, fill #3

## 2020-09-11 NOTE — Patient Instructions (Signed)

## 2020-09-11 NOTE — Progress Notes (Signed)
09/11/2020 9:14 AM   Brendan Bowman [birth year] [id number]  Referring provider: Aniceto Boss, MD [address] [place],  [place] [address]  Chief Complaint  Patient presents with  . Follow-up    HPI:  Brendan Bowman is a 45yo here for followup for BPH and nocturia. Nocturia 0x as long as he takes the flomax. Stream strong. IPSS 1 with QOL 0.  No flank pain. No stone events since last.   PMH: Past Medical History:  Diagnosis Date  . History of kidney stones   . IBS (irritable bowel syndrome)   . Seasonal allergies     Surgical History: Past Surgical History:  Procedure Laterality Date  . COLONOSCOPY     For IBS, No polyps  . CYSTOSCOPY WITH STENT PLACEMENT Bilateral 02/19/2016   Procedure: CYSTOSCOPY WITH BILATERAL URETERAL STENT PLACEMENT;  Surgeon: Malen Gauze, MD;  Location: [facility];  Service: Urology;  Laterality: Bilateral;  . CYSTOSCOPY/RETROGRADE/URETEROSCOPY/STONE EXTRACTION WITH BASKET Bilateral 02/19/2016   Procedure: CYSTOSCOPY/BILATERAL RETROGRADE PYELOGRAM/ BILATERAL STONE EXTRACTION WITH BASKET;  Surgeon: Malen Gauze, MD;  Location: [facility];  Service: Urology;  Laterality: Bilateral;  . HOLMIUM LASER APPLICATION N/A 02/19/2016   Procedure: HOLMIUM LASER APPLICATION;  Surgeon: Malen Gauze, MD;  Location: [facility];  Service: Urology;  Laterality: N/A;  . LITHOTRIPSY    . MYRINGOTOMY Bilateral as a child  . VASECTOMY  early 2017    Home Medications:  Allergies as of 09/11/2020   No Known Allergies     Medication List       Accurate as of September 11, 2020  9:14 AM. If you have any questions, ask your nurse or doctor.        colestipol 1 g tablet Commonly known as: COLESTID TAKE 2 TABLETS BY MOUTH 3 TIMES DAILY   diphenhydrAMINE 25 MG tablet Commonly known as: BENADRYL Take 25 mg by mouth at bedtime as needed for allergies.   tamsulosin 0.4 MG Caps capsule Commonly known as: FLOMAX TAKE 1 CAPSULE BY MOUTH DAILY.       Allergies: No Known  Allergies  Family History: No family history on file.  Social History:  reports that he has never smoked. He has never used smokeless tobacco. He reports that he does not drink alcohol and does not use drugs.  ROS: All other review of systems were reviewed and are negative except what is noted above in HPI  Physical Exam: BP 137/90   Pulse 80   Temp 98.1 F (36.7 C) (Oral)   Resp 12   Ht 5\' 7"  (1.702 m)   Wt 165 lb (74.8 kg)   BMI 25.84 kg/m   Constitutional:  Alert and oriented, No acute distress. HEENT: [place] AT, moist mucus membranes.  Trachea midline, no masses. Cardiovascular: No clubbing, cyanosis, or edema. Respiratory: Normal respiratory effort, no increased work of breathing. GI: Abdomen is soft, nontender, nondistended, no abdominal masses GU: No CVA tenderness.  Lymph: No cervical or inguinal lymphadenopathy. Skin: No rashes, bruises or suspicious lesions. Neurologic: Grossly intact, no focal deficits, moving all 4 extremities. Psychiatric: Normal mood and affect.  Laboratory Data: Lab Results  Component Value Date   WBC 8.0 03/14/2020   HGB 16.0 03/14/2020   HCT 47.5 03/14/2020   MCV 91 03/14/2020   PLT 234 02/17/2016    Lab Results  Component Value Date   CREATININE 0.84 03/14/2020    No results found for: PSA  Lab Results  Component Value Date   TESTOSTERONE  502 03/14/2020    No results found for: HGBA1C  Urinalysis    Component Value Date/Time   APPEARANCEUR Clear 03/14/2020 0926   GLUCOSEU Negative 03/14/2020 0926   BILIRUBINUR Negative 03/14/2020 0926   PROTEINUR Negative 03/14/2020 0926   NITRITE Negative 03/14/2020 0926   LEUKOCYTESUR Negative 03/14/2020 0926    Lab Results  Component Value Date   LABMICR Comment 03/14/2020    Pertinent Imaging:  Results for orders placed during the hospital encounter of 07/27/18  DG Abd 1 View  Narrative CLINICAL DATA:  Followup renal calculi.  EXAM: ABDOMEN - 1 VIEW  COMPARISON:   01/12/2018  FINDINGS: Small lower pole stone fragments are noted. No other definite renal calculi. No definite ureteral or bladder calculi.  The bowel gas pattern is unremarkable. The bony structures are intact.  IMPRESSION: Stable small stone fragments in the lower pole region of the left kidney.   Electronically Signed By: Rudie Meyer M.D. On: 07/27/2018 15:59  No results found for this or any previous visit.  No results found for this or any previous visit.  No results found for this or any previous visit.  Results for orders placed during the hospital encounter of 07/13/17  US RENAL  Narrative CLINICAL DATA:  Follow-up renal calculus  EXAM: RENAL / URINARY TRACT ULTRASOUND COMPLETE  COMPARISON:  08/25/2016 renal ultrasound, 01/01/2017 plain film examination  FINDINGS: Right Kidney:  Length: 13.6 cm. Echogenicity within normal limits. No mass or hydronephrosis visualized.  Left Kidney:  Length: 12.3 cm. Lower pole calculi are noted similar to that seen on the prior plain film examination. They have progressed somewhat in the interval from the prior ultrasound. No obstructive changes are noted.  Bladder:  Appears normal for degree of bladder distention.  IMPRESSION: Left renal calculi without obstructive change.   Electronically Signed By: Alcide Clever M.D. On: 07/13/2017 16:27  No results found for this or any previous visit.  No results found for this or any previous visit.  No results found for this or any previous visit.   Assessment & Plan:    1. BPH with LUTS, nocturia Continue flomax 0.4mg  daily - Urinalysis, Routine w reflex microscopic   No follow-ups on file.  Wilkie Aye, MD  [facility] [place]

## 2020-09-11 NOTE — Progress Notes (Signed)
Urological Symptom Review  Patient is experiencing the following symptoms: None listed   Review of Systems  Gastrointestinal (upper)  : Negative for upper GI symptoms  Gastrointestinal (lower) : Negative for lower GI symptoms  Constitutional : Negative for symptoms  Skin: Negative for skin symptoms  Eyes: Negative for eye symptoms  Ear/Nose/Throat : Negative for Ear/Nose/Throat symptoms  Hematologic/Lymphatic: Negative for Hematologic/Lymphatic symptoms  Cardiovascular : Negative for cardiovascular symptoms  Respiratory : Negative for respiratory symptoms  Endocrine: Negative for endocrine symptoms  Musculoskeletal: Negative for musculoskeletal symptoms  Neurological: Negative for neurological symptoms  Psychologic: Negative for psychiatric symptoms 

## 2020-09-24 IMAGING — CT CT CERVICAL SPINE W/O CM
4 of 7 series · 14 of 33 positions shown, 15 images · non-contrast
Comparison: None.

CLINICAL DATA: Head injury at work yesterday. Dizziness. Fall
yesterday.

EXAM:
CT HEAD WITHOUT CONTRAST
CT CERVICAL SPINE WITHOUT CONTRAST
TECHNIQUE: Multidetector CT imaging of the head and cervical spine was
performed following the standard protocol without intravenous
contrast. Multiplanar CT image reconstructions of the cervical spine
were also generated.

[Series 8: c spine soft · axial · 0.32mm/px · z∈[-256,-128]mm · 4 of 108 slices shown]
[im 22/108  soft-tissue]
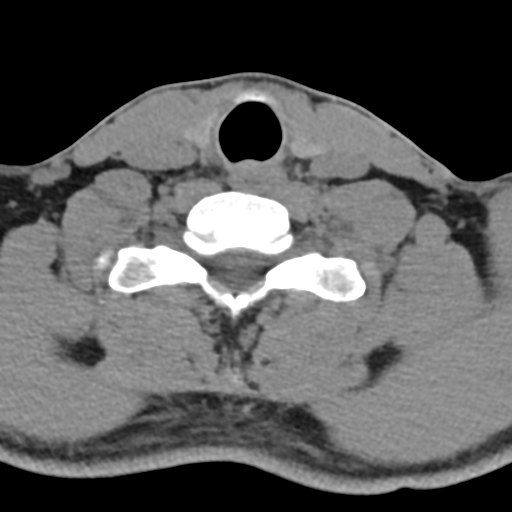
[im 43/108  soft-tissue]
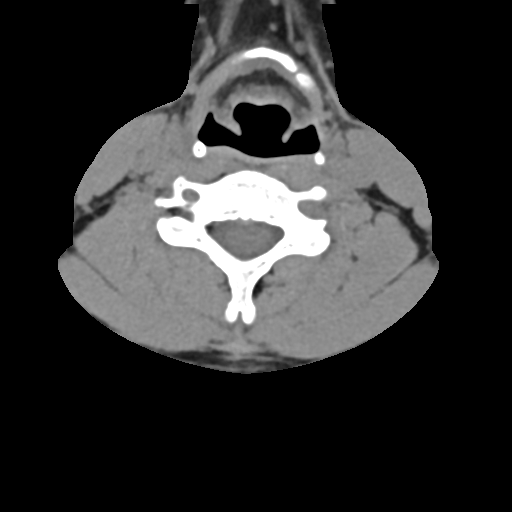
[im 65/108  soft-tissue]
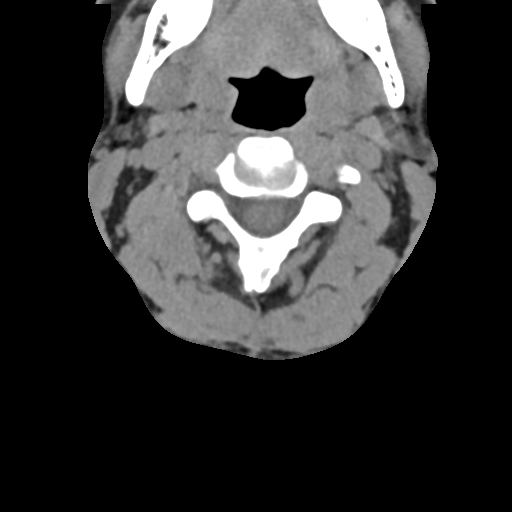
[im 86/108  soft-tissue]
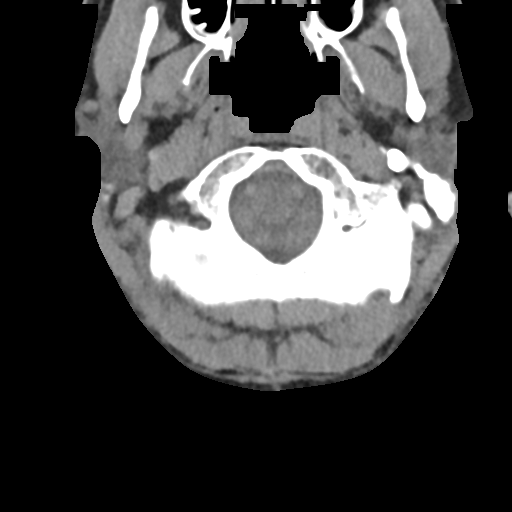

[Series 9: sagittal bone · sagittal · 0.25mm/px · 5 of 61 slices shown]
[im 11/61  bone]
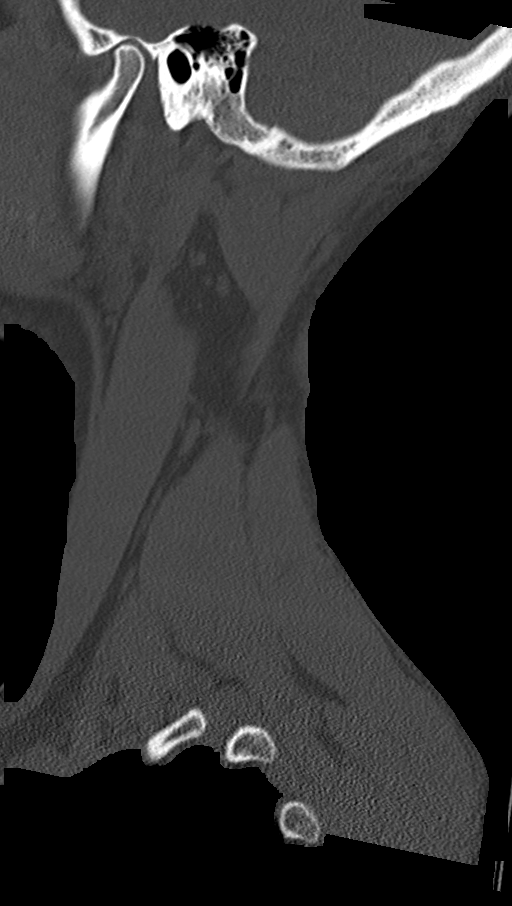
[im 21/61  bone]
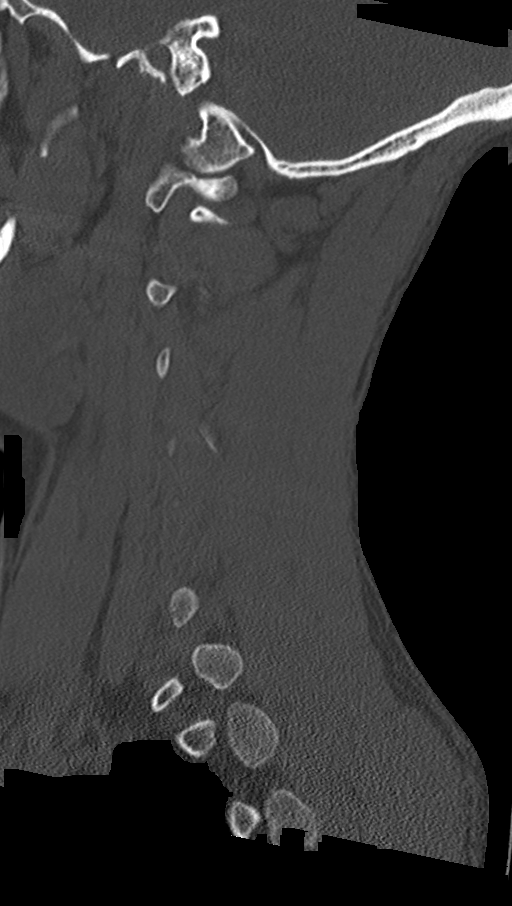
[im 31/61  bone]
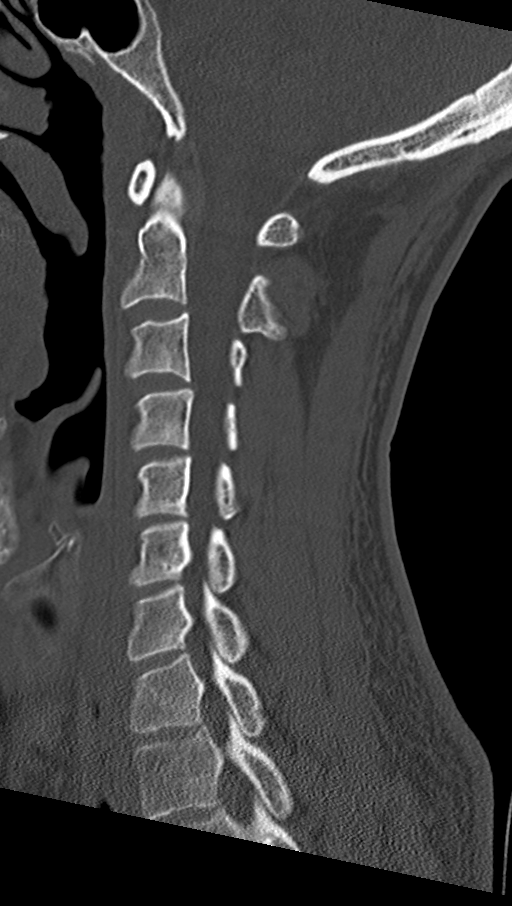
[im 41/61  bone]
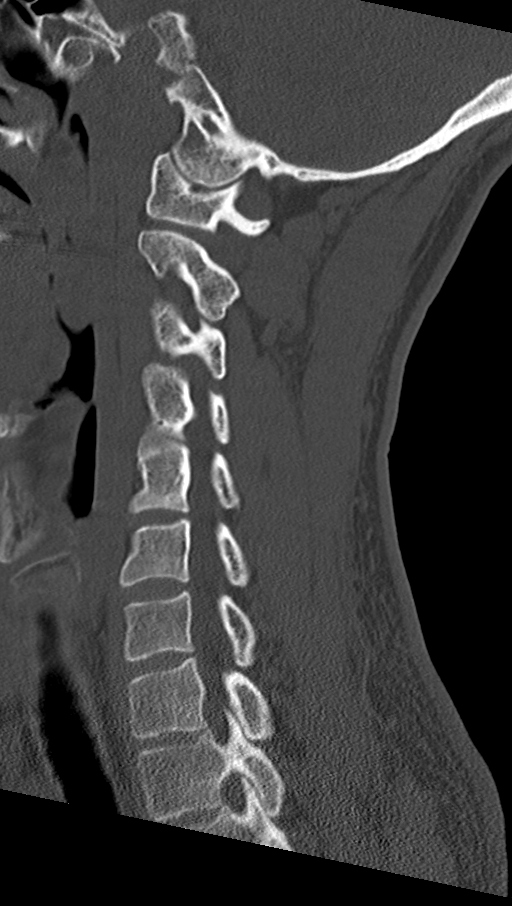
[im 51/61  bone]
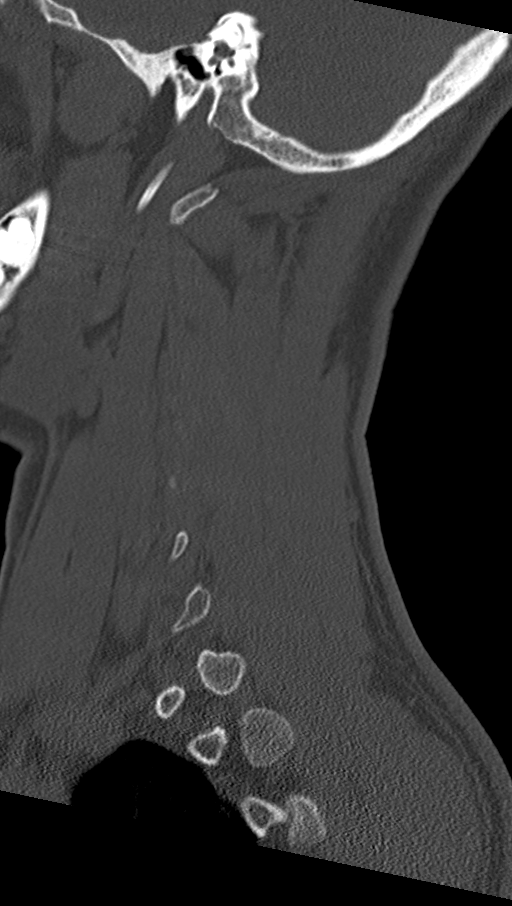

[Series 10: coronal bone · coronal · 0.24mm/px · 1 of 65 slices shown]
[im 33/65  bone]
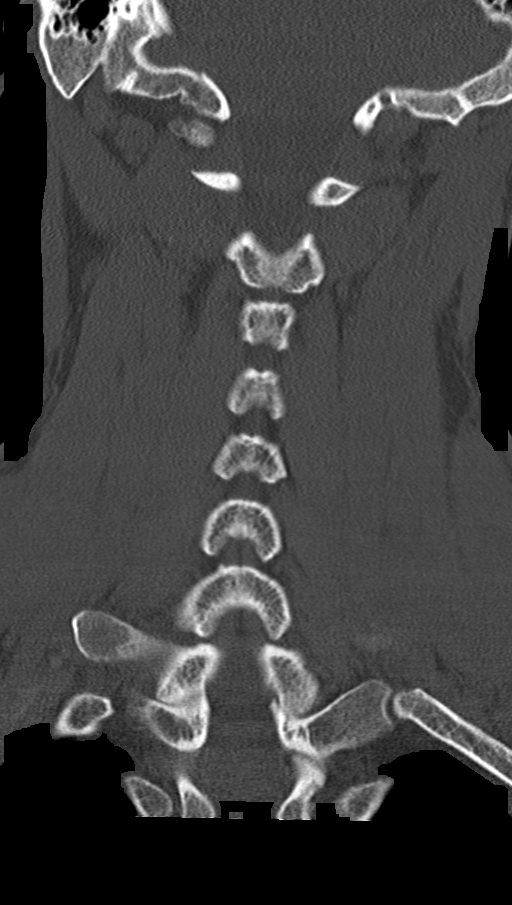

[Series 11: orthogonal bone · axial · 0.21mm/px · z∈[-272,-165]mm · 4 of 94 slices shown, 5 images]
[im 19/94  soft-tissue]
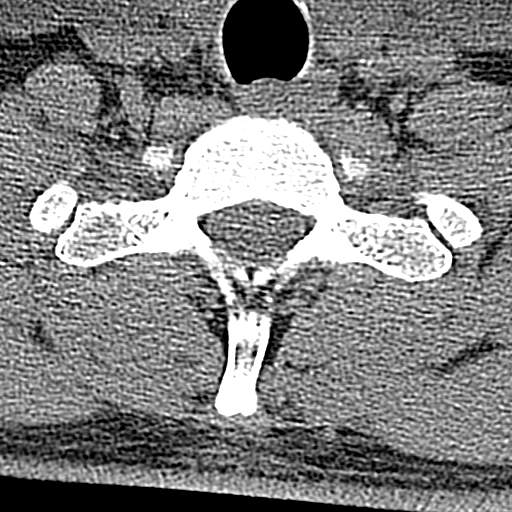
[im 19/94  bone]
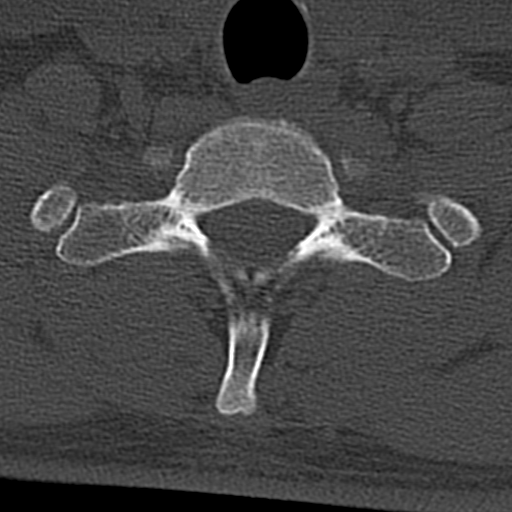
[im 38/94  bone]
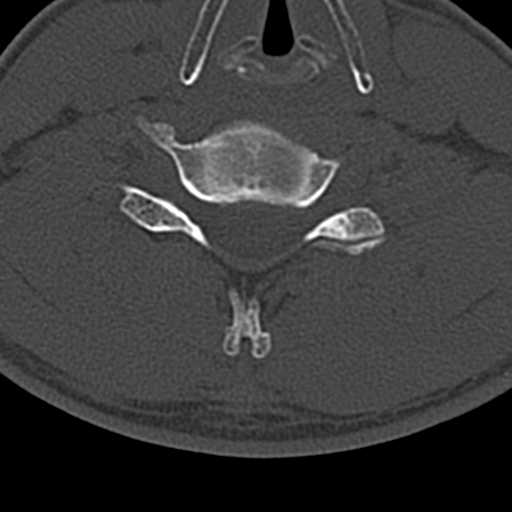
[im 56/94  bone]
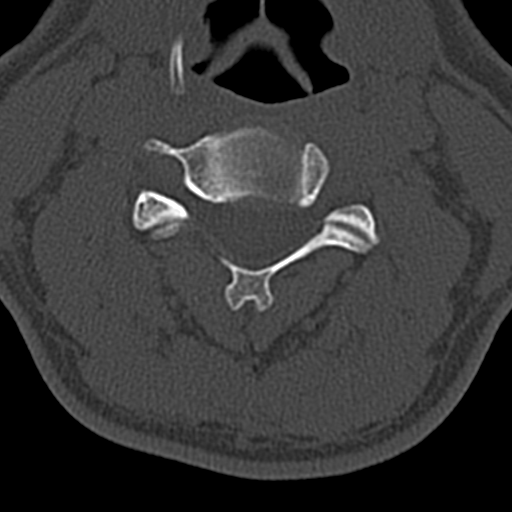
[im 75/94  bone]
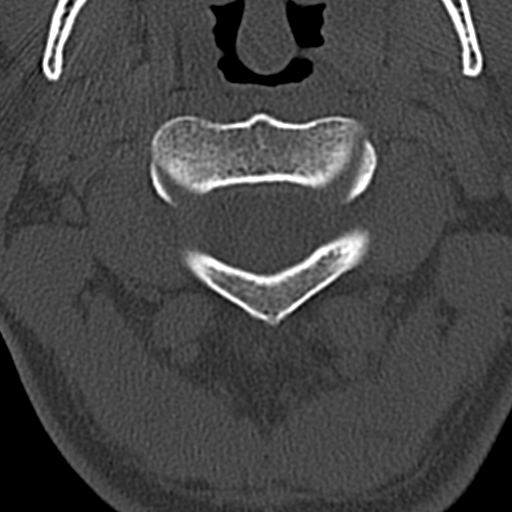

[14 of 33 positions shown; findings below may reference images not displayed]

FINDINGS: CT HEAD FINDINGS

Brain: No evidence of acute infarction, hemorrhage, hydrocephalus,
extra-axial collection or mass lesion/mass effect.

Vascular: Negative for hyperdense vessel

Skull: Negative for skull fracture

Sinuses/Orbits: Mild mucosal edema paranasal sinuses.  Normal orbit

Other: None

CT CERVICAL SPINE FINDINGS

Alignment: Normal

Skull base and vertebrae: Negative for fracture

Soft tissues and spinal canal: Negative

Disc levels: Mild disc degeneration and minimal spurring at C4-5 and
C5-6.

Upper chest: Negative

Other: None
IMPRESSION: Negative CT head and cervical spine.  No acute abnormality.

## 2020-11-11 ENCOUNTER — Other Ambulatory Visit (HOSPITAL_COMMUNITY): Payer: Self-pay

## 2020-11-11 MED FILL — Colestipol HCl Tab 1 GM: ORAL | 90 days supply | Qty: 540 | Fill #0 | Status: AC

## 2021-02-11 ENCOUNTER — Other Ambulatory Visit (HOSPITAL_COMMUNITY): Payer: Self-pay

## 2021-02-11 MED FILL — Colestipol HCl Tab 1 GM: ORAL | 90 days supply | Qty: 540 | Fill #1 | Status: AC

## 2021-02-12 ENCOUNTER — Other Ambulatory Visit (HOSPITAL_COMMUNITY): Payer: Self-pay

## 2021-05-15 ENCOUNTER — Other Ambulatory Visit (HOSPITAL_COMMUNITY): Payer: Self-pay

## 2021-05-16 ENCOUNTER — Other Ambulatory Visit (HOSPITAL_COMMUNITY): Payer: Self-pay

## 2021-05-19 ENCOUNTER — Other Ambulatory Visit (HOSPITAL_COMMUNITY): Payer: Self-pay

## 2021-05-23 ENCOUNTER — Other Ambulatory Visit (HOSPITAL_COMMUNITY): Payer: Self-pay

## 2021-05-26 ENCOUNTER — Other Ambulatory Visit (HOSPITAL_COMMUNITY): Payer: Self-pay

## 2021-06-04 ENCOUNTER — Other Ambulatory Visit (HOSPITAL_COMMUNITY): Payer: Self-pay

## 2021-06-30 ENCOUNTER — Other Ambulatory Visit (HOSPITAL_COMMUNITY): Payer: Self-pay

## 2021-08-15 ENCOUNTER — Other Ambulatory Visit (HOSPITAL_COMMUNITY): Payer: Self-pay

## 2021-09-02 ENCOUNTER — Other Ambulatory Visit: Payer: Self-pay

## 2021-09-02 ENCOUNTER — Other Ambulatory Visit (HOSPITAL_COMMUNITY): Payer: Self-pay

## 2021-09-02 DIAGNOSIS — R351 Nocturia: Secondary | ICD-10-CM

## 2021-09-02 MED ORDER — TAMSULOSIN HCL 0.4 MG PO CAPS
ORAL_CAPSULE | Freq: Every day | ORAL | 3 refills | Status: DC
  Filled 2021-09-02: qty 90, fill #0

## 2021-09-10 ENCOUNTER — Ambulatory Visit: Payer: 59 | Admitting: Urology

## 2021-11-11 ENCOUNTER — Other Ambulatory Visit (HOSPITAL_COMMUNITY): Payer: Self-pay

## 2021-11-11 ENCOUNTER — Other Ambulatory Visit: Payer: Self-pay

## 2021-11-11 DIAGNOSIS — R351 Nocturia: Secondary | ICD-10-CM

## 2021-11-11 MED ORDER — TAMSULOSIN HCL 0.4 MG PO CAPS
ORAL_CAPSULE | Freq: Every day | ORAL | 3 refills | Status: DC
  Filled 2021-11-11: qty 90, 90d supply, fill #0

## 2021-11-11 NOTE — Progress Notes (Signed)
Refill request only

## 2022-01-27 ENCOUNTER — Other Ambulatory Visit (HOSPITAL_COMMUNITY): Payer: Self-pay

## 2022-01-27 DIAGNOSIS — Z0000 Encounter for general adult medical examination without abnormal findings: Secondary | ICD-10-CM | POA: Diagnosis not present

## 2022-01-27 DIAGNOSIS — K580 Irritable bowel syndrome with diarrhea: Secondary | ICD-10-CM | POA: Diagnosis not present

## 2022-01-27 DIAGNOSIS — N400 Enlarged prostate without lower urinary tract symptoms: Secondary | ICD-10-CM | POA: Diagnosis not present

## 2022-01-27 MED ORDER — COLESTIPOL HCL 1 G PO TABS
ORAL_TABLET | ORAL | 1 refills | Status: DC
  Filled 2022-01-27: qty 239, 59d supply, fill #0
  Filled 2022-01-28: qty 121, 31d supply, fill #0
  Filled 2022-04-24: qty 89, 23d supply, fill #1
  Filled 2022-04-25: qty 271, 67d supply, fill #1

## 2022-01-28 ENCOUNTER — Ambulatory Visit (INDEPENDENT_AMBULATORY_CARE_PROVIDER_SITE_OTHER): Payer: 59 | Admitting: Urology

## 2022-01-28 ENCOUNTER — Ambulatory Visit (HOSPITAL_COMMUNITY)
Admission: RE | Admit: 2022-01-28 | Discharge: 2022-01-28 | Disposition: A | Discharge status: Home / Self Care | Payer: 59 | Source: Ambulatory Visit | Attending: Urology | Admitting: Urology

## 2022-01-28 ENCOUNTER — Other Ambulatory Visit: Payer: Self-pay

## 2022-01-28 ENCOUNTER — Other Ambulatory Visit (HOSPITAL_COMMUNITY): Payer: Self-pay

## 2022-01-28 ENCOUNTER — Encounter: Payer: Self-pay | Admitting: Urology

## 2022-01-28 VITALS — BP 123/84 | HR 81

## 2022-01-28 DIAGNOSIS — N401 Enlarged prostate with lower urinary tract symptoms: Secondary | ICD-10-CM

## 2022-01-28 DIAGNOSIS — N200 Calculus of kidney: Secondary | ICD-10-CM | POA: Diagnosis not present

## 2022-01-28 DIAGNOSIS — R351 Nocturia: Secondary | ICD-10-CM

## 2022-01-28 DIAGNOSIS — N138 Other obstructive and reflux uropathy: Secondary | ICD-10-CM

## 2022-01-28 MED ORDER — TAMSULOSIN HCL 0.4 MG PO CAPS
ORAL_CAPSULE | Freq: Every day | ORAL | 3 refills | Status: DC
  Filled 2022-01-28: qty 90, 90d supply, fill #0
  Filled 2022-04-24: qty 90, 90d supply, fill #1
  Filled 2022-07-19 – 2022-07-20 (×2): qty 90, 90d supply, fill #2

## 2022-01-28 NOTE — Patient Instructions (Signed)

## 2022-01-28 NOTE — Progress Notes (Signed)
01/28/2022 8:52 AM   Brendan Bowman [birth year] [id number]  Referring provider: Aniceto Boss, MD [address] [place],  [place] [address]  Followup nephrolithiasis and BPH   HPI: [name] is a 46yo here for followup for nephrolithiasis and BPH. IPSS 0 QOL 0 on flomax 0.4mg  daily. No noted stone event since last visit. KUB from today shows left mid pole calculus.    PMH: Past Medical History:  Diagnosis Date   History of kidney stones    IBS (irritable bowel syndrome)    Seasonal allergies     Surgical History: Past Surgical History:  Procedure Laterality Date   COLONOSCOPY     For IBS, No polyps   CYSTOSCOPY WITH STENT PLACEMENT Bilateral 02/19/2016   Procedure: CYSTOSCOPY WITH BILATERAL URETERAL STENT PLACEMENT;  Surgeon: Malen Gauze, MD;  Location: [facility];  Service: Urology;  Laterality: Bilateral;   CYSTOSCOPY/RETROGRADE/URETEROSCOPY/STONE EXTRACTION WITH BASKET Bilateral 02/19/2016   Procedure: CYSTOSCOPY/BILATERAL RETROGRADE PYELOGRAM/ BILATERAL STONE EXTRACTION WITH BASKET;  Surgeon: Malen Gauze, MD;  Location: [facility];  Service: Urology;  Laterality: Bilateral;   HOLMIUM LASER APPLICATION N/A 02/19/2016   Procedure: HOLMIUM LASER APPLICATION;  Surgeon: Malen Gauze, MD;  Location: [facility];  Service: Urology;  Laterality: N/A;   LITHOTRIPSY     MYRINGOTOMY Bilateral as a child   VASECTOMY  early 2017    Home Medications:  Allergies as of 01/28/2022   No Known Allergies      Medication List        Accurate as of January 28, 2022  8:52 AM. If you have any questions, ask your nurse or doctor.          colestipol 1 g tablet Commonly known as: COLESTID TAKE 2 TABLETS BY MOUTH 3 TIMES DAILY   colestipol 1 g tablet Commonly known as: COLESTID Take 2 tablets by mouth twice a day   diphenhydrAMINE 25 MG tablet Commonly known as: BENADRYL Take 25 mg by mouth at bedtime as needed for allergies.   tamsulosin 0.4 MG Caps capsule Commonly known as:  FLOMAX TAKE 1 CAPSULE BY MOUTH DAILY.        Allergies: No Known Allergies  Family History: No family history on file.  Social History:  reports that he has never smoked. He has never used smokeless tobacco. He reports that he does not drink alcohol and does not use drugs.  ROS: All other review of systems were reviewed and are negative except what is noted above in HPI  Physical Exam: BP 123/84   Pulse 81   Constitutional:  Alert and oriented, No acute distress. HEENT: [place] AT, moist mucus membranes.  Trachea midline, no masses. Cardiovascular: No clubbing, cyanosis, or edema. Respiratory: Normal respiratory effort, no increased work of breathing. GI: Abdomen is soft, nontender, nondistended, no abdominal masses GU: No CVA tenderness.  Lymph: No cervical or inguinal lymphadenopathy. Skin: No rashes, bruises or suspicious lesions. Neurologic: Grossly intact, no focal deficits, moving all 4 extremities. Psychiatric: Normal mood and affect.  Laboratory Data: Lab Results  Component Value Date   WBC 8.0 03/14/2020   HGB 16.0 03/14/2020   HCT 47.5 03/14/2020   MCV 91 03/14/2020   PLT 234 02/17/2016    Lab Results  Component Value Date   CREATININE 0.84 03/14/2020    No results found for: "PSA"  Lab Results  Component Value Date   TESTOSTERONE 502 03/14/2020    No results found for: "HGBA1C"  Urinalysis    Component  Value Date/Time   APPEARANCEUR Clear 09/11/2020 0915   GLUCOSEU Negative 09/11/2020 0915   BILIRUBINUR Negative 09/11/2020 0915   PROTEINUR Negative 09/11/2020 0915   NITRITE Negative 09/11/2020 0915   LEUKOCYTESUR Negative 09/11/2020 0915    Lab Results  Component Value Date   LABMICR See below: 09/11/2020   WBCUA None seen 09/11/2020   LABEPIT None seen 09/11/2020   BACTERIA None seen 09/11/2020    Pertinent Imaging: KUb today: Images reviewed and discussed with the patient  Results for orders placed during the hospital encounter of  07/27/18  DG Abd 1 View  Narrative CLINICAL DATA:  Followup renal calculi.  EXAM: ABDOMEN - 1 VIEW  COMPARISON:  01/12/2018  FINDINGS: Small lower pole stone fragments are noted. No other definite renal calculi. No definite ureteral or bladder calculi.  The bowel gas pattern is unremarkable. The bony structures are intact.  IMPRESSION: Stable small stone fragments in the lower pole region of the left kidney.   Electronically Signed By: Rudie Meyer M.D. On: 07/27/2018 15:59  No results found for this or any previous visit.  No results found for this or any previous visit.  No results found for this or any previous visit.  Results for orders placed during the hospital encounter of 07/13/17  US RENAL  Narrative CLINICAL DATA:  Follow-up renal calculus  EXAM: RENAL / URINARY TRACT ULTRASOUND COMPLETE  COMPARISON:  08/25/2016 renal ultrasound, 01/01/2017 plain film examination  FINDINGS: Right Kidney:  Length: 13.6 cm. Echogenicity within normal limits. No mass or hydronephrosis visualized.  Left Kidney:  Length: 12.3 cm. Lower pole calculi are noted similar to that seen on the prior plain film examination. They have progressed somewhat in the interval from the prior ultrasound. No obstructive changes are noted.  Bladder:  Appears normal for degree of bladder distention.  IMPRESSION: Left renal calculi without obstructive change.   Electronically Signed By: Alcide Clever M.D. On: 07/13/2017 16:27  No results found for this or any previous visit.  No results found for this or any previous visit.  No results found for this or any previous visit.   Assessment & Plan:    1. Nephrolithiasis -RTC 1 year with KUB - Urinalysis, Routine w reflex microscopic  2. Nocturia -continue flomax 0.4mg  daily  3. Benign prostatic hyperplasia with urinary obstruction -Continue flomax 0.4mg  daily.    No follow-ups on file.  Wilkie Aye,  MD  [facility] [place]

## 2022-01-29 ENCOUNTER — Other Ambulatory Visit (HOSPITAL_COMMUNITY): Payer: Self-pay

## 2022-01-29 LAB — MICROSCOPIC EXAMINATION
Bacteria, UA: NONE SEEN
Epithelial Cells (non renal): NONE SEEN /hpf (ref 0–10)
Renal Epithel, UA: NONE SEEN /hpf
WBC, UA: NONE SEEN /hpf (ref 0–5)

## 2022-01-29 LAB — URINALYSIS, ROUTINE W REFLEX MICROSCOPIC
Bilirubin, UA: NEGATIVE
Glucose, UA: NEGATIVE
Ketones, UA: NEGATIVE
Leukocytes,UA: NEGATIVE
Nitrite, UA: NEGATIVE
Protein,UA: NEGATIVE
Specific Gravity, UA: 1.02 (ref 1.005–1.030)
Urobilinogen, Ur: 0.2 mg/dL (ref 0.2–1.0)
pH, UA: 6.5 (ref 5.0–7.5)

## 2022-04-24 ENCOUNTER — Other Ambulatory Visit (HOSPITAL_COMMUNITY): Payer: Self-pay

## 2022-04-25 ENCOUNTER — Other Ambulatory Visit (HOSPITAL_COMMUNITY): Payer: Self-pay

## 2022-04-27 ENCOUNTER — Other Ambulatory Visit (HOSPITAL_COMMUNITY): Payer: Self-pay

## 2022-07-19 ENCOUNTER — Other Ambulatory Visit (HOSPITAL_COMMUNITY): Payer: Self-pay

## 2022-07-20 ENCOUNTER — Other Ambulatory Visit: Payer: Self-pay

## 2022-07-20 ENCOUNTER — Other Ambulatory Visit (HOSPITAL_COMMUNITY): Payer: Self-pay

## 2022-07-21 ENCOUNTER — Other Ambulatory Visit (HOSPITAL_COMMUNITY): Payer: Self-pay

## 2022-07-21 MED ORDER — COLESTIPOL HCL 1 G PO TABS
2.0000 g | ORAL_TABLET | Freq: Two times a day (BID) | ORAL | 0 refills | Status: DC
  Filled 2022-07-21: qty 360, 90d supply, fill #0

## 2022-07-22 ENCOUNTER — Other Ambulatory Visit (HOSPITAL_COMMUNITY): Payer: Self-pay

## 2022-08-10 ENCOUNTER — Other Ambulatory Visit: Payer: Self-pay

## 2022-08-10 ENCOUNTER — Other Ambulatory Visit (HOSPITAL_COMMUNITY): Payer: Self-pay

## 2022-08-10 DIAGNOSIS — R351 Nocturia: Secondary | ICD-10-CM

## 2022-08-10 MED ORDER — TAMSULOSIN HCL 0.4 MG PO CAPS
0.4000 mg | ORAL_CAPSULE | Freq: Every day | ORAL | 3 refills | Status: DC
  Filled 2022-08-12: qty 90, 90d supply, fill #0
  Filled 2022-11-06: qty 90, 90d supply, fill #1

## 2022-08-11 ENCOUNTER — Other Ambulatory Visit (HOSPITAL_COMMUNITY): Payer: Self-pay

## 2022-08-12 ENCOUNTER — Other Ambulatory Visit (HOSPITAL_COMMUNITY): Payer: Self-pay

## 2022-08-13 ENCOUNTER — Other Ambulatory Visit: Payer: Self-pay

## 2022-10-05 ENCOUNTER — Other Ambulatory Visit (HOSPITAL_COMMUNITY): Payer: Self-pay

## 2022-11-06 ENCOUNTER — Other Ambulatory Visit: Payer: Self-pay

## 2022-11-06 ENCOUNTER — Other Ambulatory Visit (HOSPITAL_COMMUNITY): Payer: Self-pay

## 2022-11-16 ENCOUNTER — Other Ambulatory Visit (HOSPITAL_COMMUNITY): Payer: Self-pay

## 2022-11-17 ENCOUNTER — Other Ambulatory Visit (HOSPITAL_COMMUNITY): Payer: Self-pay

## 2022-11-26 ENCOUNTER — Other Ambulatory Visit (HOSPITAL_COMMUNITY): Payer: Self-pay

## 2022-11-27 ENCOUNTER — Other Ambulatory Visit (HOSPITAL_COMMUNITY): Payer: Self-pay

## 2022-12-02 ENCOUNTER — Other Ambulatory Visit (HOSPITAL_BASED_OUTPATIENT_CLINIC_OR_DEPARTMENT_OTHER): Payer: Self-pay

## 2022-12-02 ENCOUNTER — Other Ambulatory Visit: Payer: Self-pay

## 2022-12-02 MED ORDER — COLESTIPOL HCL 1 G PO TABS
2.0000 g | ORAL_TABLET | Freq: Two times a day (BID) | ORAL | 0 refills | Status: DC
  Filled 2022-12-02: qty 120, 30d supply, fill #0

## 2022-12-18 ENCOUNTER — Other Ambulatory Visit (HOSPITAL_COMMUNITY): Payer: Self-pay

## 2022-12-18 DIAGNOSIS — E785 Hyperlipidemia, unspecified: Secondary | ICD-10-CM | POA: Diagnosis not present

## 2022-12-18 MED ORDER — COLESTIPOL HCL 1 G PO TABS
2.0000 g | ORAL_TABLET | Freq: Two times a day (BID) | ORAL | 1 refills | Status: DC
  Filled 2022-12-18 – 2023-03-28 (×2): qty 360, 90d supply, fill #0
  Filled 2023-06-22: qty 360, 90d supply, fill #1

## 2023-01-29 ENCOUNTER — Ambulatory Visit (HOSPITAL_COMMUNITY): Admission: RE | Admit: 2023-01-29 | Payer: 59 | Source: Ambulatory Visit

## 2023-01-29 ENCOUNTER — Other Ambulatory Visit: Payer: Self-pay

## 2023-01-29 ENCOUNTER — Encounter: Payer: Self-pay | Admitting: Urology

## 2023-01-29 ENCOUNTER — Ambulatory Visit (INDEPENDENT_AMBULATORY_CARE_PROVIDER_SITE_OTHER): Payer: 59 | Admitting: Urology

## 2023-01-29 VITALS — BP 116/80 | HR 84 | Wt 165.0 lb

## 2023-01-29 DIAGNOSIS — N200 Calculus of kidney: Secondary | ICD-10-CM

## 2023-01-29 DIAGNOSIS — N138 Other obstructive and reflux uropathy: Secondary | ICD-10-CM

## 2023-01-29 DIAGNOSIS — N401 Enlarged prostate with lower urinary tract symptoms: Secondary | ICD-10-CM

## 2023-01-29 DIAGNOSIS — R351 Nocturia: Secondary | ICD-10-CM | POA: Diagnosis not present

## 2023-01-29 LAB — URINALYSIS, ROUTINE W REFLEX MICROSCOPIC
Bilirubin, UA: NEGATIVE
Glucose, UA: NEGATIVE
Ketones, UA: NEGATIVE
Leukocytes,UA: NEGATIVE
Nitrite, UA: NEGATIVE
Protein,UA: NEGATIVE
Specific Gravity, UA: 1.01 (ref 1.005–1.030)
Urobilinogen, Ur: 0.2 mg/dL (ref 0.2–1.0)
pH, UA: 7.5 (ref 5.0–7.5)

## 2023-01-29 LAB — MICROSCOPIC EXAMINATION
Bacteria, UA: NONE SEEN
Epithelial Cells (non renal): NONE SEEN /hpf (ref 0–10)
WBC, UA: NONE SEEN /hpf (ref 0–5)

## 2023-01-29 MED ORDER — ALFUZOSIN HCL ER 10 MG PO TB24
10.0000 mg | ORAL_TABLET | Freq: Every day | ORAL | 11 refills | Status: DC
  Filled 2023-01-29: qty 30, 30d supply, fill #0
  Filled 2023-02-22: qty 30, 30d supply, fill #1
  Filled 2023-03-28: qty 30, 30d supply, fill #2

## 2023-01-29 NOTE — Patient Instructions (Signed)

## 2023-01-29 NOTE — Progress Notes (Unsigned)
01/29/2023 9:08 AM   Brendan Bowman [birth year] [id number]  Referring provider: Aniceto Boss, MD [address] [place],  [place] [address]  Followup BPh and nephrolithiasis    HPI: Brendan Bowman is a 47yo here for followup for BPH and nephrolithiasis. No stone events since last visit. KUB shows stable left renal calculi. He denies any flank pain. IPSS 1 QOL 0 on flomax 0.4mg  daily. Urine stream strong. No straining to urinate. Nocturia 0-1x. No other complaints today   PMH: Past Medical History:  Diagnosis Date   History of kidney stones    IBS (irritable bowel syndrome)    Seasonal allergies     Surgical History: Past Surgical History:  Procedure Laterality Date   COLONOSCOPY     For IBS, No polyps   CYSTOSCOPY WITH STENT PLACEMENT Bilateral 02/19/2016   Procedure: CYSTOSCOPY WITH BILATERAL URETERAL STENT PLACEMENT;  Surgeon: Malen Gauze, MD;  Location: [facility];  Service: Urology;  Laterality: Bilateral;   CYSTOSCOPY/RETROGRADE/URETEROSCOPY/STONE EXTRACTION WITH BASKET Bilateral 02/19/2016   Procedure: CYSTOSCOPY/BILATERAL RETROGRADE PYELOGRAM/ BILATERAL STONE EXTRACTION WITH BASKET;  Surgeon: Malen Gauze, MD;  Location: [facility];  Service: Urology;  Laterality: Bilateral;   HOLMIUM LASER APPLICATION N/A 02/19/2016   Procedure: HOLMIUM LASER APPLICATION;  Surgeon: Malen Gauze, MD;  Location: [facility];  Service: Urology;  Laterality: N/A;   LITHOTRIPSY     MYRINGOTOMY Bilateral as a child   VASECTOMY  early 2017    Home Medications:  Allergies as of 01/29/2023   No Known Allergies      Medication List        Accurate as of January 29, 2023  9:08 AM. If you have any questions, ask your nurse or doctor.          colestipol 1 g tablet Commonly known as: COLESTID Take 2 tablets (2 g total) by mouth 2 (two) times daily.   diphenhydrAMINE 25 MG tablet Commonly known as: BENADRYL Take 25 mg by mouth at bedtime as needed for allergies.   tamsulosin 0.4 MG Caps  capsule Commonly known as: FLOMAX Take 1 capsule (0.4 mg total) by mouth daily.        Allergies: No Known Allergies  Family History: No family history on file.  Social History:  reports that he has never smoked. He has never used smokeless tobacco. He reports that he does not drink alcohol and does not use drugs.  ROS: All other review of systems were reviewed and are negative except what is noted above in HPI  Physical Exam: BP 116/80   Pulse 84   Wt 165 lb (74.8 kg)   BMI 25.84 kg/m   Constitutional:  Alert and oriented, No acute distress. HEENT: [place] AT, moist mucus membranes.  Trachea midline, no masses. Cardiovascular: No clubbing, cyanosis, or edema. Respiratory: Normal respiratory effort, no increased work of breathing. GI: Abdomen is soft, nontender, nondistended, no abdominal masses GU: No CVA tenderness.  Lymph: No cervical or inguinal lymphadenopathy. Skin: No rashes, bruises or suspicious lesions. Neurologic: Grossly intact, no focal deficits, moving all 4 extremities. Psychiatric: Normal mood and affect.  Laboratory Data: Lab Results  Component Value Date   WBC 8.0 03/14/2020   HGB 16.0 03/14/2020   HCT 47.5 03/14/2020   MCV 91 03/14/2020   PLT 234 02/17/2016    Lab Results  Component Value Date   CREATININE 0.84 03/14/2020    No results found for: "PSA"  Lab Results  Component Value Date   TESTOSTERONE  502 03/14/2020    No results found for: "HGBA1C"  Urinalysis    Component Value Date/Time   APPEARANCEUR Clear 01/28/2022 0853   GLUCOSEU Negative 01/28/2022 0853   BILIRUBINUR Negative 01/28/2022 0853   PROTEINUR Negative 01/28/2022 0853   NITRITE Negative 01/28/2022 0853   LEUKOCYTESUR Negative 01/28/2022 0853    Lab Results  Component Value Date   LABMICR See below: 01/28/2022   WBCUA None seen 01/28/2022   LABEPIT None seen 01/28/2022   MUCUS Present 01/28/2022   BACTERIA None seen 01/28/2022    Pertinent Imaging: KUB  today: Images reviewed and discussed with the patient  Results for orders placed in visit on 01/28/22  DG Abd 1 View  Narrative CLINICAL DATA:  History of kidney stone  EXAM: ABDOMEN - 1 VIEW  COMPARISON:  07/27/2018  FINDINGS: Nonobstructed gas pattern. Multiple stones overlying lower pole left kidney, the largest measures 4 mm. These are stable to minimally increased. Calcifications superior to pubic symphysis probably within the prostate  IMPRESSION: Multiple left kidney stones   Electronically Signed By: Jasmine Pang M.D. On: 01/29/2022 15:35  No results found for this or any previous visit.  No results found for this or any previous visit.  No results found for this or any previous visit.  Results for orders placed during the hospital encounter of 07/13/17  US RENAL  Narrative CLINICAL DATA:  Follow-up renal calculus  EXAM: RENAL / URINARY TRACT ULTRASOUND COMPLETE  COMPARISON:  08/25/2016 renal ultrasound, 01/01/2017 plain film examination  FINDINGS: Right Kidney:  Length: 13.6 cm. Echogenicity within normal limits. No mass or hydronephrosis visualized.  Left Kidney:  Length: 12.3 cm. Lower pole calculi are noted similar to that seen on the prior plain film examination. They have progressed somewhat in the interval from the prior ultrasound. No obstructive changes are noted.  Bladder:  Appears normal for degree of bladder distention.  IMPRESSION: Left renal calculi without obstructive change.   Electronically Signed By: Alcide Clever M.D. On: 07/13/2017 16:27  No valid procedures specified. No results found for this or any previous visit.  No results found for this or any previous visit.   Assessment & Plan:    1. Nephrolithiasis Followup 1 year with KUB - Urinalysis, Routine w reflex microscopic  2. Nocturia -uroxatral 10mg  qhs  3. Benign prostatic hyperplasia with urinary obstruction -uroxatral 10mg    No follow-ups on  file.  Wilkie Aye, MD  [facility] [place]

## 2023-02-22 ENCOUNTER — Other Ambulatory Visit: Payer: Self-pay

## 2023-03-28 ENCOUNTER — Other Ambulatory Visit (HOSPITAL_COMMUNITY): Payer: Self-pay

## 2023-03-29 ENCOUNTER — Other Ambulatory Visit (HOSPITAL_COMMUNITY): Payer: Self-pay

## 2023-03-29 ENCOUNTER — Other Ambulatory Visit: Payer: Self-pay

## 2023-04-01 ENCOUNTER — Other Ambulatory Visit: Payer: Self-pay

## 2023-04-01 ENCOUNTER — Other Ambulatory Visit (HOSPITAL_COMMUNITY): Payer: Self-pay

## 2023-04-01 DIAGNOSIS — N401 Enlarged prostate with lower urinary tract symptoms: Secondary | ICD-10-CM

## 2023-04-01 MED ORDER — ALFUZOSIN HCL ER 10 MG PO TB24
10.0000 mg | ORAL_TABLET | Freq: Every day | ORAL | 3 refills | Status: DC
Start: 2023-04-01 — End: 2024-04-14
  Filled 2023-04-01 – 2023-04-27 (×2): qty 90, 90d supply, fill #0
  Filled 2023-07-22: qty 90, 90d supply, fill #1
  Filled 2023-10-17: qty 90, 90d supply, fill #2
  Filled 2024-01-18: qty 90, 90d supply, fill #3

## 2023-04-02 ENCOUNTER — Other Ambulatory Visit (HOSPITAL_COMMUNITY): Payer: Self-pay

## 2023-04-26 DIAGNOSIS — R6883 Chills (without fever): Secondary | ICD-10-CM | POA: Diagnosis not present

## 2023-04-26 DIAGNOSIS — R0789 Other chest pain: Secondary | ICD-10-CM | POA: Diagnosis not present

## 2023-04-27 ENCOUNTER — Other Ambulatory Visit (HOSPITAL_COMMUNITY): Payer: Self-pay

## 2023-04-27 ENCOUNTER — Other Ambulatory Visit: Payer: Self-pay

## 2023-06-22 ENCOUNTER — Other Ambulatory Visit: Payer: Self-pay

## 2023-07-22 ENCOUNTER — Other Ambulatory Visit (HOSPITAL_COMMUNITY): Payer: Self-pay

## 2023-07-26 ENCOUNTER — Other Ambulatory Visit (HOSPITAL_COMMUNITY): Payer: Self-pay

## 2023-07-26 ENCOUNTER — Other Ambulatory Visit: Payer: Self-pay

## 2023-09-18 ENCOUNTER — Other Ambulatory Visit (HOSPITAL_COMMUNITY): Payer: Self-pay

## 2023-09-22 ENCOUNTER — Other Ambulatory Visit (HOSPITAL_COMMUNITY): Payer: Self-pay

## 2023-10-17 ENCOUNTER — Other Ambulatory Visit (HOSPITAL_COMMUNITY): Payer: Self-pay

## 2023-10-18 ENCOUNTER — Other Ambulatory Visit (HOSPITAL_COMMUNITY): Payer: Self-pay

## 2023-10-19 ENCOUNTER — Other Ambulatory Visit (HOSPITAL_COMMUNITY): Payer: Self-pay

## 2023-10-20 ENCOUNTER — Other Ambulatory Visit (HOSPITAL_COMMUNITY): Payer: Self-pay

## 2023-10-25 ENCOUNTER — Other Ambulatory Visit (HOSPITAL_COMMUNITY): Payer: Self-pay

## 2023-11-17 DIAGNOSIS — H25813 Combined forms of age-related cataract, bilateral: Secondary | ICD-10-CM | POA: Diagnosis not present

## 2024-01-18 ENCOUNTER — Other Ambulatory Visit: Payer: Self-pay

## 2024-01-18 ENCOUNTER — Other Ambulatory Visit (HOSPITAL_COMMUNITY): Payer: Self-pay

## 2024-01-19 ENCOUNTER — Other Ambulatory Visit: Payer: Self-pay

## 2024-01-19 ENCOUNTER — Other Ambulatory Visit (HOSPITAL_COMMUNITY): Payer: Self-pay

## 2024-01-19 MED ORDER — COLESTIPOL HCL 1 G PO TABS
2.0000 g | ORAL_TABLET | Freq: Two times a day (BID) | ORAL | 0 refills | Status: AC
  Filled 2024-01-19: qty 30, 8d supply, fill #0

## 2024-01-28 ENCOUNTER — Ambulatory Visit: Payer: 59 | Admitting: Urology

## 2024-04-13 ENCOUNTER — Other Ambulatory Visit: Payer: Self-pay

## 2024-04-13 DIAGNOSIS — N200 Calculus of kidney: Secondary | ICD-10-CM

## 2024-04-14 ENCOUNTER — Other Ambulatory Visit: Payer: Self-pay

## 2024-04-14 ENCOUNTER — Other Ambulatory Visit (HOSPITAL_BASED_OUTPATIENT_CLINIC_OR_DEPARTMENT_OTHER): Payer: Self-pay

## 2024-04-14 ENCOUNTER — Ambulatory Visit (HOSPITAL_COMMUNITY)
Admission: RE | Admit: 2024-04-14 | Discharge: 2024-04-14 | Disposition: A | Discharge status: Home / Self Care | Source: Ambulatory Visit | Attending: Urology | Admitting: Urology

## 2024-04-14 ENCOUNTER — Ambulatory Visit (INDEPENDENT_AMBULATORY_CARE_PROVIDER_SITE_OTHER): Admitting: Urology

## 2024-04-14 VITALS — BP 125/78 | HR 82

## 2024-04-14 DIAGNOSIS — N200 Calculus of kidney: Secondary | ICD-10-CM

## 2024-04-14 DIAGNOSIS — N138 Other obstructive and reflux uropathy: Secondary | ICD-10-CM | POA: Diagnosis not present

## 2024-04-14 DIAGNOSIS — N401 Enlarged prostate with lower urinary tract symptoms: Secondary | ICD-10-CM | POA: Diagnosis not present

## 2024-04-14 LAB — URINALYSIS, ROUTINE W REFLEX MICROSCOPIC
Bilirubin, UA: NEGATIVE
Glucose, UA: NEGATIVE
Ketones, UA: NEGATIVE
Leukocytes,UA: NEGATIVE
Nitrite, UA: NEGATIVE
Protein,UA: NEGATIVE
RBC, UA: NEGATIVE
Specific Gravity, UA: 1.01 (ref 1.005–1.030)
Urobilinogen, Ur: 0.2 mg/dL (ref 0.2–1.0)
pH, UA: 7.5 (ref 5.0–7.5)

## 2024-04-14 MED ORDER — ALFUZOSIN HCL ER 10 MG PO TB24
10.0000 mg | ORAL_TABLET | Freq: Every day | ORAL | 3 refills | Status: AC
  Filled 2024-04-14: qty 90, 90d supply, fill #0
  Filled 2024-07-11: qty 90, 90d supply, fill #1

## 2024-04-14 NOTE — Progress Notes (Signed)
 04/14/2024 8:52 AM   Brendan Bowman 19-Nov-1975 [id number]  Referring provider: Nancee Deward BROCKS, MD [address] [place],  [place] [address]  Followup nephrolithiasis   HPI: Brendan Bowman is a 48yo here for followup for BPh and nephrolithiasis. No stone events since last visit. KUB shows stable left renal calculi. IPSS 7 QOl 1 on uroxatral  10mg  at bedtime. Urine stream is strong. No straining to urinate. Nocturia 0-1x. No other complaints today   PMH: Past Medical History:  Diagnosis Date   History of kidney stones    IBS (irritable bowel syndrome)    Seasonal allergies     Surgical History: Past Surgical History:  Procedure Laterality Date   COLONOSCOPY     For IBS, No polyps   CYSTOSCOPY WITH STENT PLACEMENT Bilateral 02/19/2016   Procedure: CYSTOSCOPY WITH BILATERAL URETERAL STENT PLACEMENT;  Surgeon: Belvie LITTIE Clara, MD;  Location: [facility];  Service: Urology;  Laterality: Bilateral;   CYSTOSCOPY/RETROGRADE/URETEROSCOPY/STONE EXTRACTION WITH BASKET Bilateral 02/19/2016   Procedure: CYSTOSCOPY/BILATERAL RETROGRADE PYELOGRAM/ BILATERAL STONE EXTRACTION WITH BASKET;  Surgeon: Belvie LITTIE Clara, MD;  Location: [facility];  Service: Urology;  Laterality: Bilateral;   HOLMIUM LASER APPLICATION N/A 02/19/2016   Procedure: HOLMIUM LASER APPLICATION;  Surgeon: Belvie LITTIE Clara, MD;  Location: [facility];  Service: Urology;  Laterality: N/A;   LITHOTRIPSY     MYRINGOTOMY Bilateral as a child   VASECTOMY  early 2017    Home Medications:  Allergies as of 04/14/2024   No Known Allergies      Medication List        Accurate as of April 14, 2024  8:52 AM. If you have any questions, ask your nurse or doctor.          alfuzosin  10 MG 24 hr tablet Commonly known as: UROXATRAL  Take 1 tablet (10 mg total) by mouth at bedtime.   colestipol  1 g tablet Commonly known as: COLESTID  Take 2 tablets (2 g total) by mouth 2 (two) times daily.   diphenhydrAMINE  25 MG tablet Commonly known as:  BENADRYL  Take 25 mg by mouth at bedtime as needed for allergies.        Allergies: No Known Allergies  Family History: No family history on file.  Social History:  reports that he has never smoked. He has never used smokeless tobacco. He reports that he does not drink alcohol and does not use drugs.  ROS: All other review of systems were reviewed and are negative except what is noted above in HPI  Physical Exam: BP 125/78   Pulse 82   Constitutional:  Alert and oriented, No acute distress. HEENT: [place] AT, moist mucus membranes.  Trachea midline, no masses. Cardiovascular: No clubbing, cyanosis, or edema. Respiratory: Normal respiratory effort, no increased work of breathing. GI: Abdomen is soft, nontender, nondistended, no abdominal masses GU: No CVA tenderness.  Lymph: No cervical or inguinal lymphadenopathy. Skin: No rashes, bruises or suspicious lesions. Neurologic: Grossly intact, no focal deficits, moving all 4 extremities. Psychiatric: Normal mood and affect.  Laboratory Data: Lab Results  Component Value Date   WBC 8.0 03/14/2020   HGB 16.0 03/14/2020   HCT 47.5 03/14/2020   MCV 91 03/14/2020   PLT 234 02/17/2016    Lab Results  Component Value Date   CREATININE 0.84 03/14/2020    No results found for: PSA  Lab Results  Component Value Date   TESTOSTERONE  502 03/14/2020    No results found for: HGBA1C  Urinalysis    Component  Value Date/Time   APPEARANCEUR Clear 01/29/2023 0903   GLUCOSEU Negative 01/29/2023 0903   BILIRUBINUR Negative 01/29/2023 0903   PROTEINUR Negative 01/29/2023 0903   NITRITE Negative 01/29/2023 0903   LEUKOCYTESUR Negative 01/29/2023 9096    Lab Results  Component Value Date   LABMICR See below: 01/29/2023   WBCUA None seen 01/29/2023   LABEPIT None seen 01/29/2023   MUCUS Present 01/28/2022   BACTERIA None seen 01/29/2023    Pertinent Imaging: KUb today: Images reviewed and discussed with the patient   Results for orders placed during the hospital encounter of 01/29/23  Abdomen 1 view (KUB)  Narrative CLINICAL DATA:  Nephrolithiasis.  EXAM: ABDOMEN - 1 VIEW  COMPARISON:  01/28/2022.  FINDINGS: The bowel gas pattern is normal. There is a 12 mm cluster of calcifications consistent with stones overlying the left kidney lower pole. No significant change compared to prior study.  IMPRESSION: Left-sided nephrolithiasis.   Electronically Signed By: Fonda Field M.D. On: 02/04/2023 21:25  No results found for this or any previous visit.  No results found for this or any previous visit.  No results found for this or any previous visit.  Results for orders placed during the hospital encounter of 07/13/17  US  RENAL  Narrative CLINICAL DATA:  Follow-up renal calculus  EXAM: RENAL / URINARY TRACT ULTRASOUND COMPLETE  COMPARISON:  08/25/2016 renal ultrasound, 01/01/2017 plain film examination  FINDINGS: Right Kidney:  Length: 13.6 cm. Echogenicity within normal limits. No mass or hydronephrosis visualized.  Left Kidney:  Length: 12.3 cm. Lower pole calculi are noted similar to that seen on the prior plain film examination. They have progressed somewhat in the interval from the prior ultrasound. No obstructive changes are noted.  Bladder:  Appears normal for degree of bladder distention.  IMPRESSION: Left renal calculi without obstructive change.   Electronically Signed By: Oneil Devonshire M.D. On: 07/13/2017 16:27  No results found for this or any previous visit.  No results found for this or any previous visit.  No results found for this or any previous visit.   Assessment & Plan:    1. Nephrolithiasis (Primary) Followup 1 year with a KUB - Urinalysis, Routine w reflex microscopic  2. Benign prostatic hyperplasia with urinary obstruction -continue uroxatral  10mg  daily - Urinalysis, Routine w reflex microscopic   No follow-ups on  file.  Belvie Clara, MD  [facility] [place]

## 2024-04-18 ENCOUNTER — Encounter: Payer: Self-pay | Admitting: Urology

## 2024-04-18 NOTE — Patient Instructions (Signed)

## 2024-07-11 ENCOUNTER — Other Ambulatory Visit: Payer: Self-pay

## 2024-07-11 ENCOUNTER — Other Ambulatory Visit (HOSPITAL_COMMUNITY): Payer: Self-pay

## 2025-04-13 ENCOUNTER — Ambulatory Visit: Admitting: Urology
